# Patient Record
Sex: Female | Born: 1970 | Race: Black or African American | Hispanic: No | Marital: Married | Smoking: Never smoker
Health system: Southern US, Community
[De-identification: ages and names within clinical notes are randomized; demographics above are authoritative.]

## PROBLEM LIST (undated history)

## (undated) DIAGNOSIS — I10 Essential (primary) hypertension: Secondary | ICD-10-CM

## (undated) DIAGNOSIS — J90 Pleural effusion, not elsewhere classified: Secondary | ICD-10-CM

## (undated) DIAGNOSIS — D649 Anemia, unspecified: Secondary | ICD-10-CM

## (undated) DIAGNOSIS — A1889 Tuberculosis of other sites: Secondary | ICD-10-CM

## (undated) HISTORY — PX: NO PAST SURGERIES: SHX2092

---

## 2015-03-09 ENCOUNTER — Ambulatory Visit: Payer: Self-pay

## 2015-05-11 DIAGNOSIS — J90 Pleural effusion, not elsewhere classified: Secondary | ICD-10-CM

## 2015-05-11 HISTORY — DX: Pleural effusion, not elsewhere classified: J90

## 2015-05-18 ENCOUNTER — Emergency Department (HOSPITAL_COMMUNITY)
Admission: EM | Admit: 2015-05-18 | Discharge: 2015-05-18 | Disposition: A | Discharge status: Home / Self Care | Payer: Medicaid Other | Source: Home / Self Care | Attending: Emergency Medicine | Admitting: Emergency Medicine

## 2015-05-18 ENCOUNTER — Encounter (HOSPITAL_COMMUNITY): Payer: Self-pay | Admitting: Emergency Medicine

## 2015-05-18 ENCOUNTER — Emergency Department (INDEPENDENT_AMBULATORY_CARE_PROVIDER_SITE_OTHER): Payer: Medicaid Other

## 2015-05-18 DIAGNOSIS — K2970 Gastritis, unspecified, without bleeding: Secondary | ICD-10-CM

## 2015-05-18 DIAGNOSIS — K8020 Calculus of gallbladder without cholecystitis without obstruction: Secondary | ICD-10-CM

## 2015-05-18 HISTORY — DX: Essential (primary) hypertension: I10

## 2015-05-18 HISTORY — DX: Anemia, unspecified: D64.9

## 2015-05-18 LAB — CBC WITH DIFFERENTIAL/PLATELET
BASOS ABS: 0 10*3/uL (ref 0.0–0.1)
Basophils Relative: 0 %
Eosinophils Absolute: 0.1 10*3/uL (ref 0.0–0.7)
Eosinophils Relative: 1 %
HCT: 29.7 % — ABNORMAL LOW (ref 36.0–46.0)
Hemoglobin: 8.6 g/dL — ABNORMAL LOW (ref 12.0–15.0)
LYMPHS ABS: 2 10*3/uL (ref 0.7–4.0)
Lymphocytes Relative: 30 %
MCH: 20.4 pg — ABNORMAL LOW (ref 26.0–34.0)
MCHC: 29 g/dL — ABNORMAL LOW (ref 30.0–36.0)
MCV: 70.4 fL — ABNORMAL LOW (ref 78.0–100.0)
MONO ABS: 0.5 10*3/uL (ref 0.1–1.0)
MONOS PCT: 8 %
NEUTROS PCT: 61 %
Neutro Abs: 4.2 10*3/uL (ref 1.7–7.7)
PLATELETS: 260 10*3/uL (ref 150–400)
RBC: 4.22 MIL/uL (ref 3.87–5.11)
RDW: 16.9 % — AB (ref 11.5–15.5)
WBC: 6.8 10*3/uL (ref 4.0–10.5)

## 2015-05-18 LAB — COMPREHENSIVE METABOLIC PANEL
ALBUMIN: 3.4 g/dL — AB (ref 3.5–5.0)
ALT: 11 U/L — ABNORMAL LOW (ref 14–54)
AST: 16 U/L (ref 15–41)
Alkaline Phosphatase: 72 U/L (ref 38–126)
Anion gap: 7 (ref 5–15)
BUN: 7 mg/dL (ref 6–20)
CHLORIDE: 106 mmol/L (ref 101–111)
CO2: 26 mmol/L (ref 22–32)
Calcium: 9.1 mg/dL (ref 8.9–10.3)
Creatinine, Ser: 0.84 mg/dL (ref 0.44–1.00)
GFR calc Af Amer: >60 mL/min (ref >60)
GFR calc non Af Amer: >60 mL/min (ref >60)
GLUCOSE: 118 mg/dL — AB (ref 65–99)
POTASSIUM: 4.1 mmol/L (ref 3.5–5.1)
Sodium: 139 mmol/L (ref 135–145)
Total Bilirubin: 0.5 mg/dL (ref 0.3–1.2)
Total Protein: 7.7 g/dL (ref 6.5–8.1)

## 2015-05-18 LAB — LIPASE, BLOOD: Lipase: 34 U/L (ref 11–51)

## 2015-05-18 MED ORDER — TRAMADOL HCL 50 MG PO TABS: 50.0000 mg | ORAL_TABLET | Freq: Four times a day (QID) | ORAL | Status: DC | PRN

## 2015-05-18 MED ORDER — ONDANSETRON HCL 4 MG PO TABS: 4.0000 mg | ORAL_TABLET | Freq: Three times a day (TID) | ORAL | Status: DC | PRN

## 2015-05-18 MED ORDER — OMEPRAZOLE 20 MG PO CPDR: 20.0000 mg | DELAYED_RELEASE_CAPSULE | Freq: Every day | ORAL | Status: DC

## 2015-05-18 NOTE — ED Provider Notes (Addendum)
CSN: [id number]     Arrival date & time 05/18/15  1616 History   First MD Initiated Contact with Patient 05/18/15 1728     Chief Complaint  Patient presents with  . Abdominal Pain    mid upper  . Diarrhea  . Emesis  . Chest Pain    right lower   (Consider location/radiation/quality/duration/timing/severity/associated sxs/prior Treatment) HPI  She is a 45 year old woman here for evaluation of abdominal pain. A phone interpreter was used for this encounter. For the last 2 weeks she has been having crampy epigastric pain any time she eats. She states as long as she does not eat anything she feels normal. Within 5 minutes of eating she will develop pain, nausea, nonbloody nonbilious vomiting, and nonbloody diarrhea.  She will also occasionally get sharp pains in the right upper quadrant that radiates to the right shoulder. This has happened 3 times in the last 2 weeks, most recently yesterday. This tends to occur about an hour after eating. She also reports intermittent fevers, once when this first started and another time 2 days ago. No history of abdominal surgery.  Past Medical History  Diagnosis Date  . Hypertension   . Anemia    History reviewed. No pertinent past surgical history. History reviewed. No pertinent family history. Social History  Substance Use Topics  . Smoking status: Never Smoker   . Smokeless tobacco: None  . Alcohol Use: No   OB History    No data available     Review of Systems  As in history of present illness Allergies  Review of patient's allergies indicates no known allergies.  Home Medications   Prior to Admission medications   Medication Sig Start Date End Date Taking? Authorizing Provider  lisinopril (PRINIVIL,ZESTRIL) 10 MG tablet Take 10 mg by mouth daily.   Yes Historical Provider, MD  omeprazole (PRILOSEC) 20 MG capsule Take 1 capsule (20 mg total) by mouth daily. 05/18/15   Charm Rings, MD  ondansetron (ZOFRAN) 4 MG tablet Take 1 tablet (4 mg  total) by mouth every 8 (eight) hours as needed for nausea or vomiting. 05/18/15   Charm Rings, MD  traMADol (ULTRAM) 50 MG tablet Take 1 tablet (50 mg total) by mouth every 6 (six) hours as needed. 05/18/15   Charm Rings, MD   Meds Ordered and Administered this Visit  Medications - No data to display  BP 146/70 mmHg  Pulse 78  Temp(Src) 98.8 F (37.1 C) (Oral)  Resp 18  SpO2 100%  LMP 05/04/2015 (Exact Date) No data found.   Physical Exam  Constitutional: She is oriented to person, place, and time. She appears well-developed and well-nourished. No distress.  Neck: Neck supple.  Cardiovascular: Normal rate, regular rhythm and normal heart sounds.   No murmur heard. Pulmonary/Chest: Effort normal and breath sounds normal. No respiratory distress. She has no wheezes. She has no rales.  Abdominal: Soft. Bowel sounds are normal. She exhibits no distension and no mass. There is tenderness (in epigastric and right upper quadrant). There is no rebound and no guarding.  She does have a positive Murphy sign. Deep palpation in the right upper quadrant has pain radiating to her right shoulder  Neurological: She is alert and oriented to person, place, and time.    ED Course  Procedures (including critical care time)  Labs Review Labs Reviewed  CBC WITH DIFFERENTIAL/PLATELET - Abnormal; Notable for the following:    Hemoglobin 8.6 (*)    HCT 29.7 (*)  MCV 70.4 (*)    MCH 20.4 (*)    MCHC 29.0 (*)    RDW 16.9 (*)    All other components within normal limits  COMPREHENSIVE METABOLIC PANEL  LIPASE, BLOOD    Imaging Review Dg Abd 2 Views  05/18/2015  CLINICAL DATA:  Right upper quadrant pain x2 weeks, low-grade fever EXAM: ABDOMEN - 2 VIEW COMPARISON:  None. FINDINGS: Nonobstructive bowel gas pattern. Moderate right colonic stool burden. No evidence of free air under the diaphragm on the upright view. Visualized osseous structures are within normal limits. IMPRESSION: No evidence of small  bowel obstruction or free air. Moderate right colonic stool burden. Electronically Signed   By: Charline Bills M.D.   On: 05/18/2015 18:48      MDM   1. Gastritis   2. Gallstone    History and exam concerning for cholelithiasis and gastritis. No fevers or tachycardia to suggest cholecystitis at this time.  Abdominal films unremarkable. We'll treat symptomatically with omeprazole, Zofran, and tramadol. She has an appointment at the [facility] scheduled for March 13 to establish care. I have discussed with the patient via the interpreter that she needs to let the doctor know about her abdominal pain. I suspect she will need an abdominal ultrasound at some point, but no urgent indication for one tonight. If her pain is getting worse, the medicines are not helping, or she develops fevers, she is to go to the emergency room.  I reviewed her discharge instructions and provided a printed handout. All questions were answered.  Charm Rings, MD 05/18/15 1610  Charm Rings, MD 05/18/15 1911

## 2015-05-18 NOTE — Discharge Instructions (Signed)
You likely have gallstones that are causing your pain. Take omeprazole daily. Take Zofran every 8 hours as needed for nausea or vomiting. Take tramadol every 8 hours as needed for severe pain. Follow-up at the [facility] cell clinic as scheduled in March. If your pain is getting worse or you develop daily fevers, please go to the emergency room.

## 2015-05-18 NOTE — ED Notes (Signed)
Pt was assessed using an Arabic interpreter [phone].  Pt has been in the country for four months from [place].  She is complaining of mid upper abdominal pain after eating accompanied with vomiting and diarrhea.  Pt only gets the pain when she eats.  She is also having right shoulder and right lower chest stabbing pain, that she has been getting about 3 times a week for the last two weeks not accompanying the abdominal pain.  She reports severe chills at times as well.

## 2015-05-30 ENCOUNTER — Encounter (HOSPITAL_COMMUNITY): Payer: Self-pay | Admitting: Emergency Medicine

## 2015-05-30 ENCOUNTER — Emergency Department (HOSPITAL_COMMUNITY)
Admission: EM | Admit: 2015-05-30 | Discharge: 2015-05-30 | Disposition: A | Discharge status: Home / Self Care | Payer: Medicaid Other | Source: Home / Self Care | Attending: Family Medicine | Admitting: Family Medicine

## 2015-05-30 ENCOUNTER — Emergency Department (INDEPENDENT_AMBULATORY_CARE_PROVIDER_SITE_OTHER): Payer: Medicaid Other

## 2015-05-30 DIAGNOSIS — R61 Generalized hyperhidrosis: Secondary | ICD-10-CM | POA: Diagnosis present

## 2015-05-30 DIAGNOSIS — Z79899 Other long term (current) drug therapy: Secondary | ICD-10-CM

## 2015-05-30 DIAGNOSIS — M25511 Pain in right shoulder: Secondary | ICD-10-CM | POA: Diagnosis present

## 2015-05-30 DIAGNOSIS — R7303 Prediabetes: Secondary | ICD-10-CM | POA: Diagnosis present

## 2015-05-30 DIAGNOSIS — A156 Tuberculous pleurisy: Principal | ICD-10-CM | POA: Diagnosis present

## 2015-05-30 DIAGNOSIS — D509 Iron deficiency anemia, unspecified: Secondary | ICD-10-CM | POA: Diagnosis present

## 2015-05-30 DIAGNOSIS — J9600 Acute respiratory failure, unspecified whether with hypoxia or hypercapnia: Secondary | ICD-10-CM | POA: Diagnosis present

## 2015-05-30 DIAGNOSIS — Z23 Encounter for immunization: Secondary | ICD-10-CM

## 2015-05-30 DIAGNOSIS — J90 Pleural effusion, not elsewhere classified: Secondary | ICD-10-CM

## 2015-05-30 DIAGNOSIS — J948 Other specified pleural conditions: Secondary | ICD-10-CM

## 2015-05-30 DIAGNOSIS — J189 Pneumonia, unspecified organism: Secondary | ICD-10-CM | POA: Diagnosis present

## 2015-05-30 DIAGNOSIS — I10 Essential (primary) hypertension: Secondary | ICD-10-CM | POA: Diagnosis present

## 2015-05-30 LAB — CBC WITH DIFFERENTIAL/PLATELET
BASOS ABS: 0.1 10*3/uL (ref 0.0–0.1)
BASOS PCT: 1 %
EOS ABS: 0.1 10*3/uL (ref 0.0–0.7)
Eosinophils Relative: 2 %
HCT: 28 % — ABNORMAL LOW (ref 36.0–46.0)
Hemoglobin: 8.4 g/dL — ABNORMAL LOW (ref 12.0–15.0)
LYMPHS ABS: 1.6 10*3/uL (ref 0.7–4.0)
Lymphocytes Relative: 24 %
MCH: 21.1 pg — AB (ref 26.0–34.0)
MCHC: 30 g/dL (ref 30.0–36.0)
MCV: 70.4 fL — AB (ref 78.0–100.0)
MONO ABS: 0.7 10*3/uL (ref 0.1–1.0)
Monocytes Relative: 10 %
NEUTROS ABS: 4.1 10*3/uL (ref 1.7–7.7)
Neutrophils Relative %: 63 %
PLATELETS: 369 10*3/uL (ref 150–400)
RBC: 3.98 MIL/uL (ref 3.87–5.11)
RDW: 17.3 % — AB (ref 11.5–15.5)
WBC: 6.6 10*3/uL (ref 4.0–10.5)

## 2015-05-30 LAB — BASIC METABOLIC PANEL
Anion gap: 11 (ref 5–15)
BUN: 6 mg/dL (ref 6–20)
CO2: 25 mmol/L (ref 22–32)
CREATININE: 0.56 mg/dL (ref 0.44–1.00)
Calcium: 8.9 mg/dL (ref 8.9–10.3)
Chloride: 103 mmol/L (ref 101–111)
GFR calc Af Amer: >60 mL/min (ref >60)
GLUCOSE: 95 mg/dL (ref 65–99)
POTASSIUM: 3.7 mmol/L (ref 3.5–5.1)
SODIUM: 139 mmol/L (ref 135–145)

## 2015-05-30 NOTE — Discharge Instructions (Signed)
You have a build up of fluid in the outer aspect of your lung  This may be infection, or some other type of consolidation.  You may need a CT scan of your chest to determine what the process stems from  Your ED provider will get you sorted out

## 2015-05-30 NOTE — ED Notes (Signed)
Per translator phone: pt from [facility] for eval of cough with sob x1 week, pt states has coughig spells. Pt states productive cough with yellow sputum, sent by [facility] for eval of fluid on right side of lung. Pt in nad able to speak complete sentences.

## 2015-05-30 NOTE — ED Notes (Signed)
The patient presented to the [facility] with a complaint of chest wall pain when coughing and a sore throat x 2 weeks.

## 2015-05-30 NOTE — ED Provider Notes (Signed)
CSN: [id number]     Arrival date & time 05/30/15  1550 History   First MD Initiated Contact with Patient 05/30/15 1740     Chief Complaint  Patient presents with  . Cough  . Sore Throat  . Pleurisy   (Consider location/radiation/quality/duration/timing/severity/associated sxs/prior Treatment) HPI history obtained from patient interpreter Pt was seen last week for abdominal pain. Now with cough. For over 1 week. Hurts to sleep on right side. Finds herself SOB when laying down. No peripheral swelling. No CP, no exertional CP or SOB she states that she mentioned to the last doctor that she had previous similar symptoms but concentration was on her abdomen. Past Medical History  Diagnosis Date  . Hypertension   . Anemia    History reviewed. No pertinent past surgical history. History reviewed. No pertinent family history. Social History  Substance Use Topics  . Smoking status: Never Smoker   . Smokeless tobacco: None  . Alcohol Use: No   OB History    No data available     Review of Systems cough Allergies  Review of patient's allergies indicates no known allergies.  Home Medications   Prior to Admission medications   Medication Sig Start Date End Date Taking? Authorizing Provider  lisinopril (PRINIVIL,ZESTRIL) 10 MG tablet Take 10 mg by mouth daily.   Yes Historical Provider, MD  omeprazole (PRILOSEC) 20 MG capsule Take 1 capsule (20 mg total) by mouth daily. 05/18/15  Yes Charm Rings, MD  ondansetron (ZOFRAN) 4 MG tablet Take 1 tablet (4 mg total) by mouth every 8 (eight) hours as needed for nausea or vomiting. 05/18/15  Yes Charm Rings, MD  traMADol (ULTRAM) 50 MG tablet Take 1 tablet (50 mg total) by mouth every 6 (six) hours as needed. 05/18/15  Yes Charm Rings, MD   Meds Ordered and Administered this Visit  Medications - No data to display  BP 141/92 mmHg  Pulse 118  Temp(Src) 98.7 F (37.1 C) (Oral)  Resp 22  SpO2 96%  LMP 05/04/2015 (Exact Date) No data  found.   Physical Exam  Constitutional: She is oriented to person, place, and time. She appears well-developed and well-nourished.  HENT:  Head: Normocephalic and atraumatic.  Right Ear: External ear normal.  Left Ear: External ear normal.  Eyes: Conjunctivae are normal.  Neck: Normal range of motion. Neck supple.  Cardiovascular: Normal rate.   Pulmonary/Chest: Effort normal.  Significantly diminished breath sounds on the right. Dullness to percussion.  Abdominal: Soft.  Musculoskeletal: Normal range of motion.  Neurological: She is alert and oriented to person, place, and time.  Skin: Skin is warm.  Psychiatric: She has a normal mood and affect. Her behavior is normal.  Nursing note and vitals reviewed.   ED Course  Procedures (including critical care time)  Labs Review Labs Reviewed - No data to display  Imaging Review No results found.   Visual Acuity Review  Right Eye Distance:   Left Eye Distance:   Bilateral Distance:    Right Eye Near:   Left Eye Near:    Bilateral Near:        Through interpreter review of x-ray with patient and her husband. MDM   1. Pleural effusion on right    Go to ER for further evaluation    Tharon Aquas, [place] 05/30/15 2051

## 2015-05-31 ENCOUNTER — Emergency Department (HOSPITAL_COMMUNITY): Payer: Medicaid Other

## 2015-05-31 ENCOUNTER — Inpatient Hospital Stay (HOSPITAL_COMMUNITY): Payer: Medicaid Other

## 2015-05-31 ENCOUNTER — Inpatient Hospital Stay (HOSPITAL_COMMUNITY)
Admission: EM | Admit: 2015-05-31 | Discharge: 2015-06-06 | DRG: 177 | Disposition: A | Discharge status: Home / Self Care | Payer: Medicaid Other | Attending: Internal Medicine | Admitting: Internal Medicine

## 2015-05-31 ENCOUNTER — Encounter (HOSPITAL_COMMUNITY): Payer: Self-pay | Admitting: Radiology

## 2015-05-31 DIAGNOSIS — M25511 Pain in right shoulder: Secondary | ICD-10-CM | POA: Diagnosis present

## 2015-05-31 DIAGNOSIS — R0600 Dyspnea, unspecified: Secondary | ICD-10-CM

## 2015-05-31 DIAGNOSIS — J9600 Acute respiratory failure, unspecified whether with hypoxia or hypercapnia: Secondary | ICD-10-CM | POA: Diagnosis present

## 2015-05-31 DIAGNOSIS — Z79899 Other long term (current) drug therapy: Secondary | ICD-10-CM | POA: Diagnosis not present

## 2015-05-31 DIAGNOSIS — I10 Essential (primary) hypertension: Secondary | ICD-10-CM | POA: Diagnosis present

## 2015-05-31 DIAGNOSIS — J9601 Acute respiratory failure with hypoxia: Secondary | ICD-10-CM | POA: Diagnosis not present

## 2015-05-31 DIAGNOSIS — R0781 Pleurodynia: Secondary | ICD-10-CM | POA: Diagnosis present

## 2015-05-31 DIAGNOSIS — D509 Iron deficiency anemia, unspecified: Secondary | ICD-10-CM | POA: Diagnosis present

## 2015-05-31 DIAGNOSIS — R509 Fever, unspecified: Secondary | ICD-10-CM | POA: Diagnosis not present

## 2015-05-31 DIAGNOSIS — A1889 Tuberculosis of other sites: Secondary | ICD-10-CM

## 2015-05-31 DIAGNOSIS — R61 Generalized hyperhidrosis: Secondary | ICD-10-CM | POA: Diagnosis present

## 2015-05-31 DIAGNOSIS — Z9889 Other specified postprocedural states: Secondary | ICD-10-CM | POA: Diagnosis not present

## 2015-05-31 DIAGNOSIS — J90 Pleural effusion, not elsewhere classified: Secondary | ICD-10-CM | POA: Diagnosis not present

## 2015-05-31 DIAGNOSIS — J9602 Acute respiratory failure with hypercapnia: Secondary | ICD-10-CM | POA: Diagnosis not present

## 2015-05-31 DIAGNOSIS — Z23 Encounter for immunization: Secondary | ICD-10-CM | POA: Diagnosis not present

## 2015-05-31 DIAGNOSIS — R0603 Acute respiratory distress: Secondary | ICD-10-CM | POA: Insufficient documentation

## 2015-05-31 DIAGNOSIS — A156 Tuberculous pleurisy: Secondary | ICD-10-CM | POA: Diagnosis present

## 2015-05-31 DIAGNOSIS — Z603 Acculturation difficulty: Secondary | ICD-10-CM

## 2015-05-31 DIAGNOSIS — R05 Cough: Secondary | ICD-10-CM | POA: Diagnosis present

## 2015-05-31 DIAGNOSIS — J189 Pneumonia, unspecified organism: Secondary | ICD-10-CM | POA: Diagnosis present

## 2015-05-31 DIAGNOSIS — R739 Hyperglycemia, unspecified: Secondary | ICD-10-CM | POA: Diagnosis present

## 2015-05-31 DIAGNOSIS — R7303 Prediabetes: Secondary | ICD-10-CM | POA: Diagnosis present

## 2015-05-31 DIAGNOSIS — J948 Other specified pleural conditions: Secondary | ICD-10-CM | POA: Diagnosis not present

## 2015-05-31 DIAGNOSIS — M25519 Pain in unspecified shoulder: Secondary | ICD-10-CM

## 2015-05-31 HISTORY — DX: Tuberculosis of other sites: A18.89

## 2015-05-31 HISTORY — DX: Pleural effusion, not elsewhere classified: J90

## 2015-05-31 LAB — IRON AND TIBC
IRON: 11 ug/dL — AB (ref 28–170)
SATURATION RATIOS: 4 % — AB (ref 10.4–31.8)
TIBC: 294 ug/dL (ref 250–450)
UIBC: 283 ug/dL

## 2015-05-31 LAB — BODY FLUID CELL COUNT WITH DIFFERENTIAL
LYMPHS FL: 67 %
MONOCYTE-MACROPHAGE-SEROUS FLUID: 10 % — AB (ref 50–90)
NEUTROPHIL FLUID: 23 % (ref 0–25)
WBC FLUID: 5629 uL — AB (ref 0–1000)

## 2015-05-31 LAB — GRAM STAIN

## 2015-05-31 LAB — GLUCOSE, SEROUS FLUID: Glucose, Fluid: 80 mg/dL

## 2015-05-31 LAB — PROTEIN, BODY FLUID: TOTAL PROTEIN, FLUID: 5.6 g/dL

## 2015-05-31 LAB — LACTATE DEHYDROGENASE, PLEURAL OR PERITONEAL FLUID: LD, Fluid: 674 U/L — ABNORMAL HIGH (ref 3–23)

## 2015-05-31 LAB — VITAMIN B12: Vitamin B-12: 2061 pg/mL — ABNORMAL HIGH (ref 180–914)

## 2015-05-31 LAB — INFLUENZA PANEL BY PCR (TYPE A & B)
H1N1FLUPCR: NOT DETECTED
INFLAPCR: NEGATIVE
INFLBPCR: NEGATIVE

## 2015-05-31 LAB — RETICULOCYTES
RBC.: 3.88 MIL/uL (ref 3.87–5.11)
Retic Count, Absolute: 50.4 10*3/uL (ref 19.0–186.0)
Retic Ct Pct: 1.3 % (ref 0.4–3.1)

## 2015-05-31 LAB — FOLATE: FOLATE: 28.8 ng/mL (ref >5.9)

## 2015-05-31 LAB — FERRITIN: Ferritin: 15 ng/mL (ref 11–307)

## 2015-05-31 LAB — TSH: TSH: 1.269 u[IU]/mL (ref 0.350–4.500)

## 2015-05-31 LAB — LACTATE DEHYDROGENASE: LDH: 198 U/L — AB (ref 98–192)

## 2015-05-31 MED ORDER — FERUMOXYTOL INJECTION 510 MG/17 ML: 510.0000 mg | INTRAVENOUS | Status: DC

## 2015-05-31 MED ORDER — CEFTRIAXONE SODIUM 1 G IJ SOLR
1.0000 g | INTRAMUSCULAR | Status: DC
  Administered 2015-06-01 – 2015-06-06 (×6): 1 g via INTRAVENOUS
  Filled 2015-05-31 (×7): qty 10

## 2015-05-31 MED ORDER — DEXTROSE 5 % IV SOLN
500.0000 mg | Freq: Once | INTRAVENOUS | Status: AC
  Administered 2015-05-31: 500 mg via INTRAVENOUS
  Filled 2015-05-31: qty 500

## 2015-05-31 MED ORDER — DEXTROSE 5 % IV SOLN: 1.0000 g | INTRAVENOUS | Status: DC

## 2015-05-31 MED ORDER — DEXTROSE 5 % IV SOLN
1.0000 g | Freq: Once | INTRAVENOUS | Status: AC
  Administered 2015-05-31: 1 g via INTRAVENOUS
  Filled 2015-05-31: qty 10

## 2015-05-31 MED ORDER — LIDOCAINE HCL (PF) 1 % IJ SOLN
INTRAMUSCULAR | Status: AC
  Filled 2015-05-31: qty 10

## 2015-05-31 MED ORDER — HYDROCODONE-ACETAMINOPHEN 5-325 MG PO TABS
1.0000 | ORAL_TABLET | Freq: Once | ORAL | Status: AC
  Administered 2015-05-31: 1 via ORAL
  Filled 2015-05-31: qty 1

## 2015-05-31 MED ORDER — LISINOPRIL 20 MG PO TABS
20.0000 mg | ORAL_TABLET | Freq: Once | ORAL | Status: AC
  Administered 2015-05-31: 20 mg via ORAL
  Filled 2015-05-31: qty 1

## 2015-05-31 MED ORDER — DEXTROSE 5 % IV SOLN: 500.0000 mg | INTRAVENOUS | Status: DC

## 2015-05-31 MED ORDER — LISINOPRIL 20 MG PO TABS: 20.0000 mg | ORAL_TABLET | Freq: Every day | ORAL | Status: DC

## 2015-05-31 MED ORDER — AZITHROMYCIN 500 MG IV SOLR
500.0000 mg | INTRAVENOUS | Status: DC
  Administered 2015-06-01: 500 mg via INTRAVENOUS
  Filled 2015-05-31: qty 500

## 2015-05-31 MED ORDER — SODIUM CHLORIDE 0.9 % IV SOLN
INTRAVENOUS | Status: DC
  Administered 2015-05-31: 10 mL/h via INTRAVENOUS
  Administered 2015-06-04: 19:00:00 via INTRAVENOUS

## 2015-05-31 MED ORDER — ENOXAPARIN SODIUM 40 MG/0.4ML ~~LOC~~ SOLN
40.0000 mg | SUBCUTANEOUS | Status: DC
  Administered 2015-05-31 – 2015-06-05 (×6): 40 mg via SUBCUTANEOUS
  Filled 2015-05-31 (×7): qty 0.4

## 2015-05-31 MED ORDER — HYDROCODONE-ACETAMINOPHEN 7.5-325 MG/15ML PO SOLN: 10.0000 mL | Freq: Once | ORAL | Status: DC

## 2015-05-31 MED ORDER — LISINOPRIL 20 MG PO TABS
20.0000 mg | ORAL_TABLET | Freq: Every day | ORAL | Status: DC
  Administered 2015-06-01 – 2015-06-06 (×6): 20 mg via ORAL
  Filled 2015-05-31 (×6): qty 1

## 2015-05-31 MED ORDER — SODIUM CHLORIDE 0.9 % IV SOLN
510.0000 mg | INTRAVENOUS | Status: DC
  Filled 2015-05-31: qty 17

## 2015-05-31 MED ORDER — IOHEXOL 300 MG/ML  SOLN
100.0000 mL | Freq: Once | INTRAMUSCULAR | Status: AC | PRN
  Administered 2015-05-31: 100 mL via INTRAVENOUS

## 2015-05-31 NOTE — ED Notes (Signed)
CT notified of IV placement. 

## 2015-05-31 NOTE — ED Notes (Signed)
Called Pharmacy to reset medications

## 2015-05-31 NOTE — ED Notes (Signed)
Admitting MD Merrel and Phlebotomy at the bedside

## 2015-05-31 NOTE — ED Notes (Signed)
This RN attempted to start IV.  Patient requesting no IV until MD has required it.  Will follow up after MD has seen patient.

## 2015-05-31 NOTE — ED Notes (Signed)
Pt returned from US

## 2015-05-31 NOTE — Procedures (Signed)
Interventional Radiology Procedure Note  Procedure: right US guided thora  Complications: None Recommendations:  - Ok to shower tomorrow - Do not submerge for 7 days - Routine care - follow up labs -pending CXR   Signed,  Yvone Neu. Loreta Ave, DO

## 2015-05-31 NOTE — Plan of Care (Signed)
45 year old female presents with shortness of breath and CT scan shows large right pleural effusion. Possible cause could be pneumonia. Patient will need thoracentesis.  [name].

## 2015-05-31 NOTE — ED Notes (Signed)
Phlebotomy at the bedside  

## 2015-05-31 NOTE — ED Notes (Signed)
Preston Fleeting, MD at bedside using pacific interpreter for interview and assessment

## 2015-05-31 NOTE — H&P (Signed)
[facility] History and Physical                                                                                    Sheena Ramirez, is a 45 y.o. female  MRN: [id number]   DOB - 1970-07-17  Admit Date - 05/31/2015  Outpatient Primary MD UNASSIGNED  Referring MD: Preston Fleeting / ER  PMH: Past Medical History  Diagnosis Date  . Hypertension   . Anemia       PSH: History reviewed. No pertinent past surgical history.   CC:  Chief Complaint  Patient presents with  . Cough     HPI: This is a 45 year old [place] female patient, has resided in the [place] 4 months (speaks only Arabic) who presents to the ER with complaints of pleuritic chest pain, fatigue and malaise and subjective fevers ongoing for about 1 week. She was having upper respiratory infection symptoms. No lower extremity swelling. No issues with dyspnea on exertion, shortness of breath or chest pain prior to onset of current symptomatology. She is up-to-date on her immunizations. She has no risk factors for TB (did not stay in refugee camp or other close quarters prior to coming to the [place]). Her only past medical history is hypertension and anemia. Likely iron deficient since husband reports she takes iron pills. She continues to have her menses. She apparently has never had a mammogram. History obtained with assistance of husband as well as with Print production planner.  ER Evaluation and treatment: Temperature 98.5, BP 155/88, pulse 99, respirations 16, at rest room air saturations 100% 2 View CXR: Increase in moderate right pleural effusion with adjacent consolidation and atelectasis  CT of chest with contrast: Moderate to large right-sided pleural effusion with layering partial consolidation right lower lobe-pneumonia +/- underlying mass cannot be excluded with diagnostic thoracentesis recommended-also a mild leftward shift of the mediastinum reflecting the pleural effusion Vicodin 5-3 251 Rocephin 1 g IV 1 Zithromax  500 mg IV 1   Review of Systems   In addition to the HPI above,  No Headache, changes with Vision or hearing, new weakness, tingling, numbness in any extremity, No problems swallowing food or Liquids, indigestion/reflux No palpitations, orthopnea  No Abdominal pain, N/V; no melena or hematochezia, no dark tarry stools, Bowel movements are regular, No dysuria, hematuria or flank pain No new skin rashes, lesions, masses or bruises, No new joints pains-aches No recent weight gain or loss No polyuria, polydypsia or polyphagia,  *A full 10 point Review of Systems was done, except as stated above, all other Review of Systems were negative.  Social History Social History  Substance Use Topics  . Smoking status: Never Smoker   . Smokeless tobacco: Not on file  . Alcohol Use: No    Resides at: Private residence  Lives with: Spouse and children  Ambulatory status: Without assistive devices   Family History Family History  Problem Relation Age of Onset  . Anemia Mother   . Heart disease Mother      Prior to Admission medications   Medication Sig Start Date End Date Taking? Authorizing Provider  lisinopril (PRINIVIL,ZESTRIL) 10 MG tablet Take 20 mg by  mouth daily.    Yes Historical Provider, MD  ondansetron (ZOFRAN) 4 MG tablet Take 1 tablet (4 mg total) by mouth every 8 (eight) hours as needed for nausea or vomiting. 05/18/15  Yes Charm Rings, MD  traMADol (ULTRAM) 50 MG tablet Take 1 tablet (50 mg total) by mouth every 6 (six) hours as needed. Patient taking differently: Take 50 mg by mouth every 6 (six) hours as needed for moderate pain.  05/18/15  Yes Charm Rings, MD    No Known Allergies  Physical Exam  Vitals  Blood pressure 143/89, pulse 101, temperature 98.5 F (36.9 C), temperature source Oral, resp. rate 24, last menstrual period 05/04/2015, SpO2 100 %.   General:  In no acute distress, appears healthy and well nourished  Psych:  Normal affect, Awake Alert,  Oriented X 3. Speech and thought patterns are clear and appropriate (obtained with assistance of Print production planner), no apparent short term memory deficits  Neuro:   No focal neurological deficits, CN II through XII intact, Strength 5/5 all 4 extremities, Sensation intact all 4 extremities.  ENT:  Ears and Eyes appear Normal, Conjunctivae clear, PER. Moist oral mucosa without erythema or exudates.  Neck:  Supple, No lymphadenopathy appreciated  Respiratory:  Symmetrical chest wall movement, very diminished right side up to mid fields, otherwise clear to auscultation. Room Air  Cardiac:  RRR, No Murmurs, no LE edema noted, no JVD, No carotid bruits, peripheral pulses palpable at 2+  Abdomen:  Positive bowel sounds, Soft, Non tender, Non distended,  No masses appreciated, no obvious hepatosplenomegaly  Skin:  No Cyanosis, Normal Skin Turgor, No Skin Rash or Bruise.  Extremities: Symmetrical without obvious trauma or injury,  no effusions.  Data Review  CBC  Recent Labs Lab 05/30/15 2007  WBC 6.6  HGB 8.4*  HCT 28.0*  PLT 369  MCV 70.4*  MCH 21.1*  MCHC 30.0  RDW 17.3*  LYMPHSABS 1.6  MONOABS 0.7  EOSABS 0.1  BASOSABS 0.1    Chemistries   Recent Labs Lab 05/30/15 2007  NA 139  K 3.7  CL 103  CO2 25  GLUCOSE 95  BUN 6  CREATININE 0.56  CALCIUM 8.9    CrCl cannot be calculated (Unknown ideal weight.).  No results for input(s): TSH, T4TOTAL, T3FREE, THYROIDAB in the last 72 hours.  Invalid input(s): FREET3  Coagulation profile No results for input(s): INR, PROTIME in the last 168 hours.  No results for input(s): DDIMER in the last 72 hours.  Cardiac Enzymes No results for input(s): CKMB, TROPONINI, MYOGLOBIN in the last 168 hours.  Invalid input(s): CK  Invalid input(s): POCBNP  Urinalysis No results found for: COLORURINE, APPEARANCEUR, LABSPEC, PHURINE, GLUCOSEU, HGBUR, BILIRUBINUR, KETONESUR, PROTEINUR, UROBILINOGEN, NITRITE,  LEUKOCYTESUR  Imaging results:   Dg Chest 2 View  05/30/2015  CLINICAL DATA:  Pt soes not understand english, PA had a interpater on the phone to talk with pt. Cough, shortness of breath. EXAM: CHEST - 2 VIEW COMPARISON:  05/18/2015 FINDINGS: Moderate right pleural effusion. Atelectasis/ consolidation of the right lung base some increase in right perihilar atelectasis since prior study. Left lung clear. Heart size normal. No pneumothorax. Visualized skeletal structures are unremarkable. IMPRESSION: 1. Increase in moderate right pleural effusion, with adjacent consolidation/atelectasis. Electronically Signed   By: Corlis Leak M.D.   On: 05/30/2015 18:22   Ct Chest W Contrast  05/31/2015  CLINICAL DATA:  Acute onset of nonproductive cough, shortness of breath, subjective fever, nausea and vomiting. Chills  and right upper chest pain. Right upper back pain and right arm pain. Initial encounter. EXAM: CT CHEST WITH CONTRAST TECHNIQUE: Multidetector CT imaging of the chest was performed during intravenous contrast administration. CONTRAST:  OMNIPAQUE IOHEXOL 300 MG/ML  SOLN COMPARISON:  Chest radiograph performed 05/30/2015 FINDINGS: A moderate to large right-sided pleural effusion is noted, with partial consolidation of the right lower lobe. Haziness is noted within the expanded portions of the right lung. The left lung appears relatively clear. No pneumothorax is seen. No definite masses are identified. There is mild leftward shift of the mediastinum. No definite mass is seen. The great vessels are grossly unremarkable in appearance. No mediastinal lymphadenopathy is seen. No pericardial effusion is identified. The visualized portions of thyroid gland are unremarkable. No axillary lymphadenopathy is seen. The visualized portions of the liver and spleen are unremarkable. The visualized portions of the gallbladder, pancreas, adrenal glands and kidneys are within normal limits. No acute osseous abnormalities  are identified. IMPRESSION: 1. Moderate to large right-sided pleural effusion, with partial consolidation of the right lower lobe. The pleural effusion is of uncertain etiology. Pneumonia could have such an appearance. Underlying mass cannot be excluded. Diagnostic thoracentesis could be considered for further evaluation, as deemed clinically appropriate. 2. Mild leftward shift of the mediastinum, reflecting the pleural effusion. Electronically Signed   By: Roanna Raider M.D.   On: 05/31/2015 05:07   Dg Abd 2 Views  05/18/2015  CLINICAL DATA:  Right upper quadrant pain x2 weeks, low-grade fever EXAM: ABDOMEN - 2 VIEW COMPARISON:  None. FINDINGS: Nonobstructive bowel gas pattern. Moderate right colonic stool burden. No evidence of free air under the diaphragm on the upright view. Visualized osseous structures are within normal limits. IMPRESSION: No evidence of small bowel obstruction or free air. Moderate right colonic stool burden. Electronically Signed   By: Charline Bills M.D.   On: 05/18/2015 18:48     EKG: (Independently reviewed) sinus tachycardia ventricular rate 105 bpm, QTC 452 ms, no ST segment elevation, no voltage criteria consistent with LVH   Assessment & Plan  Principal Problem:   Acute respiratory failure/ Pleural effusion, right/ Pleuritic chest pain -Suspect patient had an upper respiratory viral illness that has now led to a parapneumonic effusion; needs diagnostic thoracentesis-low index suspicion is primarily bacterial since does not have leukocytosis or significant fevers -Admit to medical floor/Inpatient -Discussed with pulmonary medicine in recommends diagnostic thoracentesis-if effusion re-collects after large volume thoracentesis recommend consult pulmonary medicine -Check cytology including cultures, pathology, glucose as well as LDH-obtain serum LDH to help clarify if transudate versus exudative etiology -Has history of hypertension; although symptoms not typical for  heart failure will obtain echocardiogram for completeness of evaluation -Check blood cultures, HIV, sputum culture, urinary strep and Legionella -Empiric antibiotics to cover for community-acquired organisms -Influenza PCR w/ droplet precautions as well as respiratory viral panel -Supportive care with prn oxygen for sats less than 92% -Stable on room air at rest but check ambulatory oximetry  Active Problems:   HTN  -Continue preadmission ACE inhibitor    Microcytic anemia -Patient's husband states she was taking iron prior to admission -Patient endorses she continues to experience menses -Check anemia panel and TSH    Language barrier, cultural differences -Patient requires Print production planner for appropriate communication and transfer information -Patient does not yet have a primary care physician-unclear which community clinics have Arabic physicians and/or translators available in the [place] area therefore have consulted to case management to assist family in obtaining post  discharge follow-up    Hyperglycemia -Likely related to acute stress but will check hemoglobin A1c    DVT Prophylaxis: Lovenox to begin after diagnostic thoracentesis  Family Communication:   Husband at bedside  Code Status:  Full code  Condition:  Stable  Discharge disposition: Anticipate discharge back to previous home environment once medically stable  Time spent in minutes : 60      ELLIS,ALLISON L. ANP on 05/31/2015 at 8:06 AM  You may contact me by going to [email or web address] - password [email or web address]  I am available from 7a-7p but please confirm I am on the schedule by going to Amion as above.   After 7p please contact night coverage person covering me after hours  [facility]

## 2015-05-31 NOTE — Progress Notes (Addendum)
Pleural LDH 674 and serum LDH 198 and this is consistent with exudative effusion. Oral WBCs 5629 low monocytes 10% and neutrophils 23% with lymphocytes 67%. Pleural culture pending. Of note influenza PCR was negative. TSH is normal. Serum iron is very low at 11 with a ferritin of 15 and elevated B12 2061. We'll order iron for infusion [facility]) and initial dosing on 2/22.  Junious Silk, ANP

## 2015-05-31 NOTE — ED Provider Notes (Signed)
CSN: [id number]     Arrival date & time 05/30/15  1909 History   By signing my name below, I, Sheena Ramirez, attest that this documentation has been prepared under the direction and in the presence of Dione Booze, MD . Electronically Signed: Marisue Ramirez, Scribe. 05/31/2015. 4:10 AM.   Chief Complaint  Patient presents with  . Cough   The history is provided by the patient. A language interpreter was used.   HPI Comments:  Sheena Ramirez is a 45 y.o. female with PMHx of HTN and anemia referred by [facility] who presents to the Emergency Department complaining of persistent, non-productive cough for a week. She notes cough is worse when she lays on her right side. Pt reports associated shortness of breath, subjective fever, nausea, vomiting secondary to coughing, chills, right upper chest pain, right upper back pain, and right arm pain. No alleviating factors noted. Pt was evaluated last week for abdominal pain and was given medication for vomiting. She has been in the [place] for four months; she came to the [place] from [place] where she had been living for 12 years. Pt denies h/o smoking, abdominal pain, pain in legs or any other symptoms at this time.  Past Medical History  Diagnosis Date  . Hypertension   . Anemia    History reviewed. No pertinent past surgical history. No family history on file. Social History  Substance Use Topics  . Smoking status: Never Smoker   . Smokeless tobacco: None  . Alcohol Use: No   OB History    No data available     Review of Systems  Constitutional: Positive for fever and chills.  Respiratory: Positive for cough.   Cardiovascular: Positive for chest pain.  Gastrointestinal: Positive for nausea and vomiting. Negative for abdominal pain.  Musculoskeletal: Positive for back pain and arthralgias.  All other systems reviewed and are negative.  Allergies  Review of patient's allergies indicates no known allergies.  Home Medications   Prior to  Admission medications   Medication Sig Start Date End Date Taking? Authorizing Provider  lisinopril (PRINIVIL,ZESTRIL) 10 MG tablet Take 20 mg by mouth daily.    Yes Historical Provider, MD  ondansetron (ZOFRAN) 4 MG tablet Take 1 tablet (4 mg total) by mouth every 8 (eight) hours as needed for nausea or vomiting. 05/18/15  Yes Charm Rings, MD  traMADol (ULTRAM) 50 MG tablet Take 1 tablet (50 mg total) by mouth every 6 (six) hours as needed. Patient taking differently: Take 50 mg by mouth every 6 (six) hours as needed for moderate pain.  05/18/15  Yes Charm Rings, MD   BP 154/88 mmHg  Pulse 102  Temp(Src) 98.5 F (36.9 C) (Oral)  Resp 26  SpO2 100%  LMP 05/04/2015 (Exact Date) Physical Exam  Constitutional: She is oriented to person, place, and time. She appears well-developed and well-nourished.  appears moderately dyspneic, but speaks in complete sentences  HENT:  Head: Normocephalic and atraumatic.  Right Ear: Hearing normal.  Left Ear: Hearing normal.  Nose: Nose normal.  Mouth/Throat: Oropharynx is clear and moist and mucous membranes are normal.  Eyes: Conjunctivae and EOM are normal. Pupils are equal, round, and reactive to light.  Neck: Normal range of motion. Neck supple. No JVD present.  Cardiovascular: Normal rate, regular rhythm, S1 normal, S2 normal and normal heart sounds.  Exam reveals no gallop and no friction rub.   No murmur heard. Pulmonary/Chest: Effort normal. She has no wheezes. She has no  rales. She exhibits no tenderness.  Markedly decreased breath sounds in right lung  Abdominal: Soft. Normal appearance and bowel sounds are normal. She exhibits no mass. There is no hepatosplenomegaly. There is no tenderness. There is no tenderness at McBurney's point and negative Murphy's sign. No hernia.  Musculoskeletal: Normal range of motion. She exhibits no edema.  Lymphadenopathy:    She has no cervical adenopathy.  Neurological: She is alert and oriented to person,  place, and time. She has normal strength. No cranial nerve deficit or sensory deficit. She exhibits normal muscle tone. Coordination normal. GCS eye subscore is 4. GCS verbal subscore is 5. GCS motor subscore is 6.  Skin: Skin is warm, dry and intact. No rash noted. No cyanosis.  Psychiatric: She has a normal mood and affect. Her speech is normal and behavior is normal. Judgment and thought content normal.  Nursing note and vitals reviewed.   ED Course  Procedures  DIAGNOSTIC STUDIES:  Oxygen Saturation is 100% on RA, normal by my interpretation.    COORDINATION OF CARE:  3:16 AM Discussed imaging results with pt. Will order a CT. Informed pt she needs an IV for the scan and that she would most likely be admitted to the hospital. Discussed treatment plan with pt at bedside and pt agreed to plan.  Labs Review Results for orders placed or performed during the hospital encounter of 05/31/15  CBC with Differential  Result Value Ref Range   WBC 6.6 4.0 - 10.5 K/uL   RBC 3.98 3.87 - 5.11 MIL/uL   Hemoglobin 8.4 (L) 12.0 - 15.0 g/dL   HCT 08.6 (L) 57.8 - 46.9 %   MCV 70.4 (L) 78.0 - 100.0 fL   MCH 21.1 (L) 26.0 - 34.0 pg   MCHC 30.0 30.0 - 36.0 g/dL   RDW 62.9 (H) 52.8 - 41.3 %   Platelets 369 150 - 400 K/uL   Neutrophils Relative % 63 %   Lymphocytes Relative 24 %   Monocytes Relative 10 %   Eosinophils Relative 2 %   Basophils Relative 1 %   Neutro Abs 4.1 1.7 - 7.7 K/uL   Lymphs Abs 1.6 0.7 - 4.0 K/uL   Monocytes Absolute 0.7 0.1 - 1.0 K/uL   Eosinophils Absolute 0.1 0.0 - 0.7 K/uL   Basophils Absolute 0.1 0.0 - 0.1 K/uL   RBC Morphology POLYCHROMASIA PRESENT    Smear Review LARGE PLATELETS PRESENT   Basic metabolic panel  Result Value Ref Range   Sodium 139 135 - 145 mmol/L   Potassium 3.7 3.5 - 5.1 mmol/L   Chloride 103 101 - 111 mmol/L   CO2 25 22 - 32 mmol/L   Glucose, Bld 95 65 - 99 mg/dL   BUN 6 6 - 20 mg/dL   Creatinine, Ser 2.44 0.44 - 1.00 mg/dL   Calcium 8.9  8.9 - 01.0 mg/dL   GFR calc non Af Amer >60 >60 mL/min   GFR calc Af Amer >60 >60 mL/min   Anion gap 11 5 - 15    Imaging Review Dg Chest 2 View  05/30/2015  CLINICAL DATA:  Pt soes not understand english, PA had a interpater on the phone to talk with pt. Cough, shortness of breath. EXAM: CHEST - 2 VIEW COMPARISON:  05/18/2015 FINDINGS: Moderate right pleural effusion. Atelectasis/ consolidation of the right lung base some increase in right perihilar atelectasis since prior study. Left lung clear. Heart size normal. No pneumothorax. Visualized skeletal structures are unremarkable. IMPRESSION: 1. Increase  in moderate right pleural effusion, with adjacent consolidation/atelectasis. Electronically Signed   By: Corlis Leak M.D.   On: 05/30/2015 18:22   Ct Chest W Contrast  05/31/2015  CLINICAL DATA:  Acute onset of nonproductive cough, shortness of breath, subjective fever, nausea and vomiting. Chills and right upper chest pain. Right upper back pain and right arm pain. Initial encounter. EXAM: CT CHEST WITH CONTRAST TECHNIQUE: Multidetector CT imaging of the chest was performed during intravenous contrast administration. CONTRAST:  OMNIPAQUE IOHEXOL 300 MG/ML  SOLN COMPARISON:  Chest radiograph performed 05/30/2015 FINDINGS: A moderate to large right-sided pleural effusion is noted, with partial consolidation of the right lower lobe. Haziness is noted within the expanded portions of the right lung. The left lung appears relatively clear. No pneumothorax is seen. No definite masses are identified. There is mild leftward shift of the mediastinum. No definite mass is seen. The great vessels are grossly unremarkable in appearance. No mediastinal lymphadenopathy is seen. No pericardial effusion is identified. The visualized portions of thyroid gland are unremarkable. No axillary lymphadenopathy is seen. The visualized portions of the liver and spleen are unremarkable. The visualized portions of the  gallbladder, pancreas, adrenal glands and kidneys are within normal limits. No acute osseous abnormalities are identified. IMPRESSION: 1. Moderate to large right-sided pleural effusion, with partial consolidation of the right lower lobe. The pleural effusion is of uncertain etiology. Pneumonia could have such an appearance. Underlying mass cannot be excluded. Diagnostic thoracentesis could be considered for further evaluation, as deemed clinically appropriate. 2. Mild leftward shift of the mediastinum, reflecting the pleural effusion. Electronically Signed   By: Roanna Raider M.D.   On: 05/31/2015 05:07   I have personally reviewed and evaluated these images and lab results as part of my medical decision-making.   MDM   Final diagnoses:  Pleural effusion, right  Community acquired pneumonia  Microcytic hypochromic anemia    Patient with them right-sided chest pain and dyspnea with x-ray showing large right pleural effusion. Old records are reviewed and she had been sent here from urgent care. She was seen at urgent care for some epigastric discomfort on February 8 and was given prescriptions for tramadol and ondansetron. X-rays were done at that time showing no pleural effusion. Laboratory workup shows microcytic anemia which is probably iron deficiency. No prior labs are available for comparison but this does appear to be chronic. CT scan confirms large pleural effusion with some consolidation of the right lower lobe which could possibly represent pneumonia. She is started on antibiotics for community-acquired pneumonia-ceftriaxone and azithromycin. Please note, that the diagnosis of pneumonia was not obvious on presentation which accounts for the delay in starting antibiotics. Case is discussed with Dr. Toniann Fail [facility] who agrees to admit the patient.  I personally performed the services described in this documentation, which was scribed in my presence. The recorded information has  been reviewed and is accurate.      Dione Booze, MD 05/31/15 [phone]

## 2015-06-01 ENCOUNTER — Inpatient Hospital Stay (HOSPITAL_COMMUNITY): Payer: Medicaid Other

## 2015-06-01 DIAGNOSIS — R0600 Dyspnea, unspecified: Secondary | ICD-10-CM

## 2015-06-01 DIAGNOSIS — R509 Fever, unspecified: Secondary | ICD-10-CM | POA: Insufficient documentation

## 2015-06-01 DIAGNOSIS — J189 Pneumonia, unspecified organism: Secondary | ICD-10-CM | POA: Insufficient documentation

## 2015-06-01 DIAGNOSIS — J90 Pleural effusion, not elsewhere classified: Secondary | ICD-10-CM | POA: Insufficient documentation

## 2015-06-01 DIAGNOSIS — Z9889 Other specified postprocedural states: Secondary | ICD-10-CM | POA: Insufficient documentation

## 2015-06-01 DIAGNOSIS — I10 Essential (primary) hypertension: Secondary | ICD-10-CM

## 2015-06-01 DIAGNOSIS — D509 Iron deficiency anemia, unspecified: Secondary | ICD-10-CM

## 2015-06-01 DIAGNOSIS — M25511 Pain in right shoulder: Secondary | ICD-10-CM

## 2015-06-01 DIAGNOSIS — J948 Other specified pleural conditions: Secondary | ICD-10-CM

## 2015-06-01 LAB — COMPREHENSIVE METABOLIC PANEL
ALT: 16 U/L (ref 14–54)
AST: 15 U/L (ref 15–41)
Albumin: 2.5 g/dL — ABNORMAL LOW (ref 3.5–5.0)
Alkaline Phosphatase: 82 U/L (ref 38–126)
Anion gap: 13 (ref 5–15)
CHLORIDE: 103 mmol/L (ref 101–111)
CO2: 26 mmol/L (ref 22–32)
CREATININE: 0.58 mg/dL (ref 0.44–1.00)
Calcium: 8.8 mg/dL — ABNORMAL LOW (ref 8.9–10.3)
GFR calc Af Amer: >60 mL/min (ref >60)
Glucose, Bld: 108 mg/dL — ABNORMAL HIGH (ref 65–99)
Potassium: 3.9 mmol/L (ref 3.5–5.1)
Sodium: 142 mmol/L (ref 135–145)
Total Bilirubin: 0.2 mg/dL — ABNORMAL LOW (ref 0.3–1.2)
Total Protein: 7 g/dL (ref 6.5–8.1)

## 2015-06-01 LAB — CBC WITH DIFFERENTIAL/PLATELET
BASOS PCT: 0 %
Basophils Absolute: 0 10*3/uL (ref 0.0–0.1)
EOS ABS: 0 10*3/uL (ref 0.0–0.7)
EOS PCT: 1 %
HCT: 26.9 % — ABNORMAL LOW (ref 36.0–46.0)
HEMOGLOBIN: 8 g/dL — AB (ref 12.0–15.0)
LYMPHS ABS: 1.3 10*3/uL (ref 0.7–4.0)
Lymphocytes Relative: 28 %
MCH: 21.1 pg — AB (ref 26.0–34.0)
MCHC: 29.7 g/dL — ABNORMAL LOW (ref 30.0–36.0)
MCV: 70.8 fL — AB (ref 78.0–100.0)
MONO ABS: 0.6 10*3/uL (ref 0.1–1.0)
Monocytes Relative: 13 %
Neutro Abs: 2.7 10*3/uL (ref 1.7–7.7)
Neutrophils Relative %: 58 %
Platelets: 329 10*3/uL (ref 150–400)
RBC: 3.8 MIL/uL — ABNORMAL LOW (ref 3.87–5.11)
RDW: 17.3 % — ABNORMAL HIGH (ref 11.5–15.5)
WBC: 4.6 10*3/uL (ref 4.0–10.5)

## 2015-06-01 LAB — MISC LABCORP TEST (SEND OUT): Labcorp test code: 88062

## 2015-06-01 LAB — EXPECTORATED SPUTUM ASSESSMENT W REFEX TO RESP CULTURE

## 2015-06-01 LAB — EXPECTORATED SPUTUM ASSESSMENT W GRAM STAIN, RFLX TO RESP C

## 2015-06-01 LAB — PH, BODY FLUID: pH, Body Fluid: 7.6

## 2015-06-01 LAB — HIV ANTIBODY (ROUTINE TESTING W REFLEX): HIV SCREEN 4TH GENERATION: NONREACTIVE

## 2015-06-01 LAB — HEMOGLOBIN A1C
Hgb A1c MFr Bld: 6.2 % — ABNORMAL HIGH (ref 4.8–5.6)
MEAN PLASMA GLUCOSE: 131 mg/dL

## 2015-06-01 LAB — STREP PNEUMONIAE URINARY ANTIGEN: Strep Pneumo Urinary Antigen: NEGATIVE

## 2015-06-01 MED ORDER — SODIUM CHLORIDE 0.9 % IV SOLN
510.0000 mg | INTRAVENOUS | Status: DC
  Administered 2015-06-01: 510 mg via INTRAVENOUS
  Filled 2015-06-01: qty 17

## 2015-06-01 MED ORDER — ACETAMINOPHEN 325 MG PO TABS
650.0000 mg | ORAL_TABLET | Freq: Four times a day (QID) | ORAL | Status: DC | PRN
  Administered 2015-06-01 – 2015-06-04 (×2): 650 mg via ORAL
  Filled 2015-06-01 (×2): qty 2

## 2015-06-01 NOTE — Care Management Note (Signed)
Case Management Note  Patient Details  Name: Arienne Gartin MRN: [id number] Date of Birth: Mar 31, 1971  Subjective/Objective:                    Action/Plan:   Expected Discharge Date:                  Expected Discharge Plan:  Home/Self Care  In-House Referral:     Discharge planning Services  CM Consult, [facility]  Post Acute Care Choice:    Choice offered to:     DME Arranged:    DME Agency:     HH Arranged:    HH Agency:     Status of Service:  In process, will continue to follow  Medicare Important Message Given:    Date Medicare IM Given:    Medicare IM give by:    Date Additional Medicare IM Given:    Additional Medicare Important Message give by:     If discussed at Long Length of Stay Meetings, dates discussed:    Additional Comments:  Epifanio Lesches, RN 06/01/2015, 3:18 PM

## 2015-06-01 NOTE — Progress Notes (Signed)
UR COMPLETED  

## 2015-06-01 NOTE — Consult Note (Signed)
Date of Admission:  05/31/2015  Date of Consult:  06/01/2015  Reason for Consult: concern for TB pleural effusion Referring Physician: Dr. Cruzita Lederer   HPI:  Sheena Ramirez is an 45 y.o. female who immigrated to the [place] from [place] approximately 4 months ago. She had an seen in the emergency department on February 8 with some right sided abdominal pain. At that time she was reporting intermittent fevers, and pain radiating up to her right shoulder. That time apparently she had a positive Murphy sign with pain with the patella palpation a rapid quadrant she was thought to have potential cholecystitis and was treated symptomatically. Apparently they did not think she had cholangitis as she was not prescribed antibiotics. She then returned to the emerge department on February 20 with persistent pain and now with cough and persistent fevers as well. She was having exertional dyspnea on exertion as well. She is found to have a significant right-sided pleural effusion on x-ray on the 20th. CT scan was performed which showed a moderate to large sized pleural effusion on the right causing tracheal deviation left words and with consolidation of the right lower lobe. She was started on ceftriaxone and azithromycin. IR performed a thoracocentesis and 1.3 L of greenish fluid was removed.   Pleural fluid was exudative with pleural throat fluid protein of 5.6 LDH of 676 white blood cells of 5629 with a lymphocytic predominance with 67% lymphocytes 23% neutrophils and 10% monocytes. Pleural fluid glucose was 80.  Pleural fluid cultures are without growth after 1 day.  After initial admission it was found by further investigation of the patient had actually been feeling unwell for nearly a year. She apparently been suffering for night sweats during this time. 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  In addition over the last 4-8 weeks she was having worsening dyspnea on exertion and cough which is largely been nonproductive and fevers.  The only person in their family with tuberculosis or close contacts is the grandmother of her husband who did suffer them tuberculosis otherwise there is not a specific known tuberculosis contacts.      Past Medical History  Diagnosis Date  . Hypertension   . Anemia   . Pleural effusion 05/2015    Past Surgical History  Procedure Laterality Date  . No past surgeries      Social History:  reports that she has never smoked. She has never used smokeless tobacco. She reports that she does not drink alcohol or use illicit drugs.   Family History  Problem Relation Age of Onset  . Anemia Mother   . Heart disease Mother     No Known Allergies   Medications: I have reviewed patients current medications as documented in Epic Anti-infectives    Start     Dose/Rate Route Frequency Ordered Stop   06/01/15 0600  azithromycin (ZITHROMAX) 500 mg in dextrose 5 % 250 mL IVPB     500 mg 250 mL/hr over 60 Minutes Intravenous Every 24 hours 05/31/15 1229 06/08/15 0559   06/01/15 0600  cefTRIAXone (ROCEPHIN) 1 g in dextrose 5 % 50 mL IVPB     1 g 100 mL/hr over 30 Minutes Intravenous Every 24 hours 05/31/15 1229 06/08/15 0559   05/31/15 1230  cefTRIAXone (ROCEPHIN) 1 g in dextrose 5 %  50 mL IVPB  Status:  Discontinued     1 g 100 mL/hr over 30 Minutes Intravenous Every 24 hours 05/31/15 1221 05/31/15 1229   05/31/15 1230  azithromycin (ZITHROMAX) 500 mg in dextrose 5 % 250 mL IVPB  Status:  Discontinued     500 mg 250 mL/hr over 60 Minutes Intravenous Every 24 hours 05/31/15 1221 05/31/15 1229   05/31/15 0530  cefTRIAXone (ROCEPHIN) 1 g in dextrose 5 % 50 mL IVPB     1 g 100 mL/hr over 30 Minutes Intravenous  Once 05/31/15 0515 05/31/15 0609   05/31/15 0530  azithromycin (ZITHROMAX)  500 mg in dextrose 5 % 250 mL IVPB     500 mg 250 mL/hr over 60 Minutes Intravenous  Once 05/31/15 0515 05/31/15 0711         ROS:  as in HPI otherwise remainder of 12 point Review of Systems is negative   Blood pressure 147/61, pulse 79, temperature 98.8 F (37.1 C), temperature source Oral, resp. rate 18, weight 246 lb 11.1 oz (111.9 kg), last menstrual period 05/26/2015, SpO2 100 %. General: Alert and awake, oriented x3, not in any acute distress. HEENT: anicteric sclera,  EOMI, oropharynx clear and without exudate Cardiovascular: tegular rate, normal r,  no murmur rubs or gallops Pulmonary: Diminished breath sounds in the right side with dullness to percussion.  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 Neuro: nonfocal, strength and sensation intact   Results for orders placed or performed during the hospital encounter of 05/31/15 (from the past 48 hour(s))  CBC with Differential     Status: Abnormal   Collection Time: 05/30/15  8:07 PM  Result Value Ref Range   WBC 6.6 4.0 - 10.5 K/uL   RBC 3.98 3.87 - 5.11 MIL/uL   Hemoglobin 8.4 (L) 12.0 - 15.0 g/dL   HCT 28.0 (L) 36.0 - 46.0 %   MCV 70.4 (L) 78.0 - 100.0 fL   MCH 21.1 (L) 26.0 - 34.0 pg   MCHC 30.0 30.0 - 36.0 g/dL   RDW 17.3 (H) 11.5 - 15.5 %   Platelets 369 150 - 400 K/uL   Neutrophils Relative % 63 %   Lymphocytes Relative 24 %   Monocytes Relative 10 %   Eosinophils Relative 2 %   Basophils Relative 1 %   Neutro Abs 4.1 1.7 - 7.7 K/uL   Lymphs Abs 1.6 0.7 - 4.0 K/uL   Monocytes Absolute 0.7 0.1 - 1.0 K/uL   Eosinophils Absolute 0.1 0.0 - 0.7 K/uL   Basophils Absolute 0.1 0.0 - 0.1 K/uL   RBC Morphology POLYCHROMASIA PRESENT     Comment:  ELLIPTOCYTES   Smear Review LARGE PLATELETS PRESENT   Basic metabolic panel     Status: None   Collection Time: 05/30/15  8:07 PM  Result Value Ref Range   Sodium 139 135 - 145 mmol/L   Potassium 3.7 3.5 - 5.1 mmol/L   Chloride 103 101 - 111 mmol/L   CO2 25 22 - 32 mmol/L   Glucose, Bld 95 65 - 99 mg/dL   BUN 6 6 - 20 mg/dL   Creatinine, Ser 0.56 0.44 - 1.00 mg/dL   Calcium 8.9 8.9 - 10.3 mg/dL   GFR calc non Af Amer >60 >60 mL/min  GFR calc Af Amer >60 >60 mL/min    Comment: (NOTE) The eGFR has been calculated using the CKD EPI equation. This calculation has not been validated in all clinical situations. eGFR's persistently <60 mL/min signify possible Chronic Kidney Disease.    Anion gap 11 5 - 15  Culture, blood (routine x 2)     Status: None (Preliminary result)   Collection Time: 05/31/15  7:52 AM  Result Value Ref Range   Specimen Description BLOOD LEFT ANTECUBITAL    Special Requests BOTTLES DRAWN AEROBIC AND ANAEROBIC 10CC    Culture NO GROWTH 1 DAY    Report Status PENDING   Culture, blood (routine x 2)     Status: None (Preliminary result)   Collection Time: 05/31/15  8:02 AM  Result Value Ref Range   Specimen Description BLOOD LEFT HAND    Special Requests BOTTLES DRAWN AEROBIC ONLY 10CC    Culture NO GROWTH 1 DAY    Report Status PENDING   Vitamin B12     Status: Abnormal   Collection Time: 05/31/15  8:07 AM  Result Value Ref Range   Vitamin B-12 2061 (H) 180 - 914 pg/mL    Comment: (NOTE) This assay is not validated for testing neonatal or myeloproliferative syndrome specimens for Vitamin B12 levels.   Folate     Status: None   Collection Time: 05/31/15  8:07 AM  Result Value Ref Range   Folate 28.8 >5.9 ng/mL  Iron and TIBC     Status: Abnormal   Collection Time: 05/31/15  8:07 AM  Result Value Ref Range   Iron 11 (L) 28 - 170 ug/dL   TIBC 294 250 - 450 ug/dL   Saturation Ratios 4 (L) 10.4 - 31.8 %   UIBC 283 ug/dL  Ferritin     Status: None    Collection Time: 05/31/15  8:07 AM  Result Value Ref Range   Ferritin 15 11 - 307 ng/mL  Reticulocytes     Status: None   Collection Time: 05/31/15  8:07 AM  Result Value Ref Range   Retic Ct Pct 1.3 0.4 - 3.1 %   RBC. 3.88 3.87 - 5.11 MIL/uL   Retic Count, Manual 50.4 19.0 - 186.0 K/uL  TSH     Status: None   Collection Time: 05/31/15  8:07 AM  Result Value Ref Range   TSH 1.269 0.350 - 4.500 uIU/mL  Influenza panel by PCR (type A & B, H1N1)     Status: None   Collection Time: 05/31/15  8:27 AM  Result Value Ref Range   Influenza A By PCR NEGATIVE NEGATIVE   Influenza B By PCR NEGATIVE NEGATIVE   H1N1 flu by pcr NOT DETECTED NOT DETECTED    Comment:        The Xpert Flu assay (FDA approved for nasal aspirates or washes and nasopharyngeal swab specimens), is intended as an aid in the diagnosis of influenza and should not be used as a sole basis for treatment.   Lactate dehydrogenase (CSF, pleural or peritoneal fluid)     Status: Abnormal   Collection Time: 05/31/15  9:29 AM  Result Value Ref Range   LD, Fluid 674 (H) 3 - 23 U/L    Comment: (NOTE) Results should be evaluated in conjunction with serum values    Fluid Type-FLDH Pleural R   Protein, pleural or peritoneal fluid     Status: None   Collection Time: 05/31/15  9:29 AM  Result Value Ref Range  Total protein, fluid 5.6 g/dL    Comment: (NOTE) No normal range established for this test Results should be evaluated in conjunction with serum values    Fluid Type-FTP Pleural R   Body fluid cell count with differential     Status: Abnormal   Collection Time: 05/31/15  9:29 AM  Result Value Ref Range   Fluid Type-FCT Pleural R    Color, Fluid YELLOW (A) YELLOW   Appearance, Fluid HAZY (A) CLEAR   WBC, Fluid 5629 (H) 0 - 1000 cu mm   Neutrophil Count, Fluid 23 0 - 25 %   Lymphs, Fluid 67 %   Monocyte-Macrophage-Serous Fluid 10 (L) 50 - 90 %  Glucose, pleural or peritoneal fluid     Status: None   Collection Time:  05/31/15  9:29 AM  Result Value Ref Range   Glucose, Fluid 80 mg/dL    Comment: (NOTE) No normal range established for this test Results should be evaluated in conjunction with serum values    Fluid Type-FGLU Pleural R   Miscellaneous [facility] test (send-out)     Status: None   Collection Time: 05/31/15  9:29 AM  Result Value Ref Range   [facility] test code [id number]    [facility] test name AMYLASE    Source ([facility]) 1ML PLEURAL FLUID RIGHT ROOM TEMP    Misc [facility] result COMMENT     Comment: (NOTE) Test Ordered: [id number] Amylase, Body Fluid Amylase, Body Fluid            48               U/L      BN   ________________________________________________________ :  Peritoneal  :       Pleural          :   Synovial     : :______________:________________________:________________: :              : Transudate :  Exudate  :                : :______________:____________:___________:________________: :  88-109 U/L  : Not Estab. : Not Estab.:   Not Estab.   : :______________:____________:___________:________________: The method performance specifications have not been established for this test in body fluid. The test result should be integrated into the clinical context for interpretation. The method performance specifications have not been established for this test in body fluid.  The test result should be integrated into the clinical context for interpretation. Performed At: [facility] [address] [place], [place] [id number] Lindon Romp MD [id number]   Culture, body fluid-bottle     Status: None (Preliminary result)   Collection Time: 05/31/15  9:29 AM  Result Value Ref Range   Specimen Description FLUID PLEURAL RIGHT    Special Requests NONE    Culture NO GROWTH 1 DAY    Report Status PENDING   Gram stain     Status: None   Collection Time: 05/31/15  9:29 AM  Result Value Ref Range   Specimen Description FLUID PLEURAL RIGHT    Special Requests NONE    Gram Stain        FEW WBC PRESENT, PREDOMINANTLY PMN NO ORGANISMS SEEN    Report Status 05/31/2015 FINAL   Lactate dehydrogenase     Status: Abnormal   Collection Time: 05/31/15 10:03 AM  Result Value Ref Range   LDH 198 (H) 98 - 192 U/L  HIV antibody     Status: None  Collection Time: 05/31/15  1:15 PM  Result Value Ref Range   HIV Screen 4th Generation wRfx Non Reactive Non Reactive    Comment: (NOTE) Performed At: [facility] [address] [place], [place] [id number] Lindon Romp MD [id number]   Hemoglobin A1c     Status: Abnormal   Collection Time: 05/31/15  1:15 PM  Result Value Ref Range   Hgb A1c MFr Bld 6.2 (H) 4.8 - 5.6 %    Comment: (NOTE)         Pre-diabetes: 5.7 - 6.4         Diabetes: >6.4         Glycemic control for adults with diabetes: <7.0    Mean Plasma Glucose 131 mg/dL    Comment: (NOTE) Performed At: [facility] [address] [place], [place] [id number] Lindon Romp MD [id number]   Culture, sputum-assessment     Status: None   Collection Time: 06/01/15  6:57 AM  Result Value Ref Range   Specimen Description SPUTUM    Special Requests NONE    Sputum evaluation      MICROSCOPIC FINDINGS SUGGEST THAT THIS SPECIMEN IS NOT REPRESENTATIVE OF LOWER RESPIRATORY SECRETIONS. PLEASE RECOLLECT. Gram Stain Report Called to,Read Back By and Verified With: K PRICE,RN AT [id number] 06/01/15 BY L BENFIELD    Report Status 06/01/2015 FINAL   Comprehensive metabolic panel     Status: Abnormal   Collection Time: 06/01/15  7:17 AM  Result Value Ref Range   Sodium 142 135 - 145 mmol/L   Potassium 3.9 3.5 - 5.1 mmol/L   Chloride 103 101 - 111 mmol/L   CO2 26 22 - 32 mmol/L   Glucose, Bld 108 (H) 65 - 99 mg/dL   BUN <5 (L) 6 - 20 mg/dL   Creatinine, Ser 0.58 0.44 - 1.00 mg/dL   Calcium 8.8 (L) 8.9 - 10.3 mg/dL   Total Protein 7.0 6.5 - 8.1 g/dL   Albumin 2.5 (L) 3.5 - 5.0 g/dL   AST 15 15 - 41 U/L   ALT 16 14 - 54 U/L   Alkaline Phosphatase 82 38  - 126 U/L   Total Bilirubin 0.2 (L) 0.3 - 1.2 mg/dL   GFR calc non Af Amer >60 >60 mL/min   GFR calc Af Amer >60 >60 mL/min    Comment: (NOTE) The eGFR has been calculated using the CKD EPI equation. This calculation has not been validated in all clinical situations. eGFR's persistently <60 mL/min signify possible Chronic Kidney Disease.    Anion gap 13 5 - 15  CBC WITH DIFFERENTIAL     Status: Abnormal   Collection Time: 06/01/15  7:17 AM  Result Value Ref Range   WBC 4.6 4.0 - 10.5 K/uL   RBC 3.80 (L) 3.87 - 5.11 MIL/uL   Hemoglobin 8.0 (L) 12.0 - 15.0 g/dL   HCT 26.9 (L) 36.0 - 46.0 %   MCV 70.8 (L) 78.0 - 100.0 fL   MCH 21.1 (L) 26.0 - 34.0 pg   MCHC 29.7 (L) 30.0 - 36.0 g/dL   RDW 17.3 (H) 11.5 - 15.5 %   Platelets 329 150 - 400 K/uL   Neutrophils Relative % 58 %   Lymphocytes Relative 28 %   Monocytes Relative 13 %   Eosinophils Relative 1 %   Basophils Relative 0 %   Neutro Abs 2.7 1.7 - 7.7 K/uL   Lymphs Abs 1.3 0.7 - 4.0 K/uL   Monocytes Absolute 0.6 0.1 -  1.0 K/uL   Eosinophils Absolute 0.0 0.0 - 0.7 K/uL   Basophils Absolute 0.0 0.0 - 0.1 K/uL   RBC Morphology POLYCHROMASIA PRESENT   Strep pneumoniae urinary antigen     Status: None   Collection Time: 06/01/15 10:06 AM  Result Value Ref Range   Strep Pneumo Urinary Antigen NEGATIVE NEGATIVE    Comment:        Infection due to S. pneumoniae cannot be absolutely ruled out since the antigen present may be below the detection limit of the test.   Culture, expectorated sputum-assessment     Status: None   Collection Time: 06/01/15 12:01 PM  Result Value Ref Range   Specimen Description SPUTUM    Special Requests NONE    Sputum evaluation      MICROSCOPIC FINDINGS SUGGEST THAT THIS SPECIMEN IS NOT REPRESENTATIVE OF LOWER RESPIRATORY SECRETIONS. PLEASE RECOLLECT. RESULT CALLED TO, READ BACK BY AND VERIFIED WITH: K PRICE 06/01/15 @ 1234 M VESTAL    Report Status 06/01/2015 FINAL     _0 (sdes,specrequest,cult,reptstatus)   ) Recent Results (from the past 720 hour(s))  Culture, blood (routine x 2)     Status: None (Preliminary result)   Collection Time: 05/31/15  7:52 AM  Result Value Ref Range Status   Specimen Description BLOOD LEFT ANTECUBITAL  Final   Special Requests BOTTLES DRAWN AEROBIC AND ANAEROBIC 10CC  Final   Culture NO GROWTH 1 DAY  Final   Report Status PENDING  Incomplete  Culture, blood (routine x 2)     Status: None (Preliminary result)   Collection Time: 05/31/15  8:02 AM  Result Value Ref Range Status   Specimen Description BLOOD LEFT HAND  Final   Special Requests BOTTLES DRAWN AEROBIC ONLY 10CC  Final   Culture NO GROWTH 1 DAY  Final   Report Status PENDING  Incomplete  Culture, body fluid-bottle     Status: None (Preliminary result)   Collection Time: 05/31/15  9:29 AM  Result Value Ref Range Status   Specimen Description FLUID PLEURAL RIGHT  Final   Special Requests NONE  Final   Culture NO GROWTH 1 DAY  Final   Report Status PENDING  Incomplete  Gram stain     Status: None   Collection Time: 05/31/15  9:29 AM  Result Value Ref Range Status   Specimen Description FLUID PLEURAL RIGHT  Final   Special Requests NONE  Final   Gram Stain   Final    FEW WBC PRESENT, PREDOMINANTLY PMN NO ORGANISMS SEEN    Report Status 05/31/2015 FINAL  Final  Culture, sputum-assessment     Status: None   Collection Time: 06/01/15  6:57 AM  Result Value Ref Range Status   Specimen Description SPUTUM  Final   Special Requests NONE  Final   Sputum evaluation   Final    MICROSCOPIC FINDINGS SUGGEST THAT THIS SPECIMEN IS NOT REPRESENTATIVE OF LOWER RESPIRATORY SECRETIONS. PLEASE RECOLLECT. Gram Stain Report Called to,Read Back By and Verified With: K PRICE,RN AT [id number] 06/01/15 BY L BENFIELD    Report Status 06/01/2015 FINAL  Final  Culture, expectorated sputum-assessment     Status: None   Collection Time: 06/01/15 12:01 PM  Result Value Ref  Range Status   Specimen Description SPUTUM  Final   Special Requests NONE  Final   Sputum evaluation   Final    MICROSCOPIC FINDINGS SUGGEST THAT THIS SPECIMEN IS NOT REPRESENTATIVE OF LOWER RESPIRATORY SECRETIONS. PLEASE RECOLLECT. RESULT CALLED TO, READ BACK BY  AND VERIFIED WITH: K PRICE 06/01/15 @ 1234 M VESTAL    Report Status 06/01/2015 FINAL  Final     Impression/Recommendation  Principal Problem:   Acute respiratory failure (HCC) Active Problems:   Pleural effusion, right   Pleuritic chest pain   HTN (hypertension)   Microcytic anemia   Language barrier, cultural differences   Hyperglycemia   Liba Hulsey is a 45 y.o. female of [place] origin with year long hx of night sweats, prior shoulder pain treated we believe with injectable steroids, then several week to possible 2 month hx of abdominal pain, with aggressive dyspnea on exertion and fevers now found to have a very large right-sided pleural effusion with right lower lobe consolidation. Pleural fluid characteristics are consistent with an exudative pleural effusion though not with an empyema. There is concern rightfully so for possible tuberculosis effusion.  #1 Exudative pleural effusion with concerns for possible tuberculosis effusion  --she needs a repeat large volume thoracocentesis with pleural fluid sent for  --ADENOSINE DE-AMINASE --MTB BY PCR --SHE SHOULD HAVE LARGE VOLUME SENT FOR AFB STAIN AND CULTURE  --I would also send fluid for fungal culture and  --fluid for cytology  --she needs at least THREE AFB sputa collected separated by 8 hours  --if she not produce sputum that I would get respiratory therapy to induce sputum for culture.  --Maintain negative airborne precautions even if AFB sputum are negative until we have come to a decision about whether she has tuberculosis that is in need of treatment or not.  --She may need help from pulmonary for possible pleural-based biopsy because  first UA can find out from the fluid.  --I would get a Quantiferon gold though if it is negative would not exclude TB --HIV test needs to be done --stop her azithromycin to increase yield on AFB cultures --ok to continue the ceftriaxone for now  #2 Shoulder pain: likely referred pain from her pleural effusion which may have gone back many months therefore. Consider imaging of shoulder but at this point not overwhelmed by exam of shoulder  I spent greater than 70 nutes with the patient including greater than 50% of time in face to face counsel of the patient andher husband using an Fish farm manager in person and in coordination of her care       06/01/2015, 3:09 PM   Thank you so much for this interesting consult  [place] [facility] [place] [phone] (pager) [phone] (office) 06/01/2015, 3:09 PM  Rhina [place] 06/01/2015, 3:09 PM

## 2015-06-01 NOTE — Progress Notes (Signed)
PROGRESS NOTE  Sheena Ramirez WUJ:[id number] DOB: [birth year] DOA: 05/31/2015 PCP: No primary care provider on file. Outpatient Specialists:    LOS: 1 day   Brief Narrative: 45 year old female with history of hypertension, anemia, was admitted on 2/21 with shortness of breath, found to have a right pleural effusion.  Assessment & Plan: Principal Problem:   Acute respiratory failure (HCC) Active Problems:   Pleural effusion, right   Pleuritic chest pain   HTN (hypertension)   Microcytic anemia   Language barrier, cultural differences   Hyperglycemia   Right-sided pleural effusion/pleuritic chest pain with possible community-acquired pneumonia - Patient underwent thoracentesis on 2/21, fluid analysis appears to be exudate - She endorses subjective fevers and night sweats as well as cough for the past 12 months. She denies any contacts with tuberculosis, however her mother-in-law had TB that was treated in the 37s.  - We'll need to rule out tuberculosis given systemic symptoms as well as indolent onset of shortness of breath, I consulted infectious disease today, discussed with Dr. Daiva Eves, who will see patient in consult, appreciate input - HIV negative - Continue ceftriaxone and azithromycin for now  Hypertension - Continue oral medications with her home lisinopril, blood pressure 147/61 today  Microcytic anemia - Anemia panel shows iron deficiency anemia, status post Feraheme IV on 2/22 - Prescribed iron supplements on discharge  Prediabetes - Hemoglobin A1c of 6.2 - Will probably need metformin on discharge   DVT prophylaxis: Lovenox Code Status: Full Family Communication: d/w husband bedside Disposition Plan: home when ready Barriers for discharge: TB r/o  Consultants:   ID  Procedures:   2D echo:  Study Conclusions - Left ventricle: The cavity size was normal. Systolic function was normal. The estimated ejection fraction was in the range of 55%to  60%. Wall motion was normal; there were no regional wallmotion abnormalities. - Mitral valve: There was trivial regurgitation. - Tricuspid valve: There was trivial regurgitation.  Antimicrobials:  Ceftriaxone 2/21 >>  Azithromycin 2/21 >>   Subjective: - She feels today that her breathing has significantly improved. Interpreter at bedside and helps with translating from Arabic. She endorses mild residual pleuritic type pain on the right. She is endorsing night sweats, coughing for the past 12 months. She endorses a 4 pound weight loss over the last 3 weeks  Objective: Filed Vitals:   06/01/15 1000 06/01/15 1217 06/01/15 1315 06/01/15 1442  BP: 144/74 142/88 142/67 147/61  Pulse: 85 85 79   Temp:  99 F (37.2 C) 98.8 F (37.1 C)   TempSrc:  Oral Oral   Resp:      Weight:      SpO2:       No intake or output data in the 24 hours ending 06/01/15 1541 Filed Weights   05/31/15 1607  Weight: 111.9 kg (246 lb 11.1 oz)    Examination: BP 147/61 mmHg  Pulse 79  Temp(Src) 98.8 F (37.1 C) (Oral)  Resp 18  Wt 111.9 kg (246 lb 11.1 oz)  SpO2 100%  LMP 05/26/2015 General exam: NAD Respiratory system: Clear. No increased work of breathing. No wheezing, no crackles Cardiovascular system: regular rate and rhythm, no murmurs, gallops. No JVD. No peripheral edema.  Gastrointestinal system: Abdomen is nondistended, soft and nontender. Normal bowel sounds heard. Central nervous system: AxOx3. No focal deficits Extremities: No clubbing/cyanosis Skin: no rashes  Data Reviewed: I have personally reviewed following labs and imaging studies  CBC:  Recent Labs Lab 05/30/15 2007 06/01/15 9562  WBC 6.6 4.6  NEUTROABS 4.1 2.7  HGB 8.4* 8.0*  HCT 28.0* 26.9*  MCV 70.4* 70.8*  PLT 369 329   Basic Metabolic Panel:  Recent Labs Lab 05/30/15 2007 06/01/15 0717  NA 139 142  K 3.7 3.9  CL 103 103  CO2 25 26  GLUCOSE 95 108*  BUN 6 <5*  CREATININE 0.56 0.58  CALCIUM 8.9 8.8*     GFR: CrCl cannot be calculated (Unknown ideal weight.). Liver Function Tests:  Recent Labs Lab 06/01/15 0717  AST 15  ALT 16  ALKPHOS 82  BILITOT 0.2*  PROT 7.0  ALBUMIN 2.5*   No results for input(s): LIPASE, AMYLASE in the last 168 hours. No results for input(s): AMMONIA in the last 168 hours. Coagulation Profile: No results for input(s): INR, PROTIME in the last 168 hours. Cardiac Enzymes: No results for input(s): CKTOTAL, CKMB, CKMBINDEX, TROPONINI in the last 168 hours. BNP (last 3 results) No results for input(s): PROBNP in the last 8760 hours. HbA1C:  Recent Labs  05/31/15 1315  HGBA1C 6.2*   CBG: No results for input(s): GLUCAP in the last 168 hours. Lipid Profile: No results for input(s): CHOL, HDL, LDLCALC, TRIG, CHOLHDL, LDLDIRECT in the last 72 hours. Thyroid Function Tests:  Recent Labs  05/31/15 0807  TSH 1.269   Anemia Panel:  Recent Labs  05/31/15 0807  VITAMINB12 2061*  FOLATE 28.8  FERRITIN 15  TIBC 294  IRON 11*  RETICCTPCT 1.3   Urine analysis: No results found for: COLORURINE, APPEARANCEUR, LABSPEC, PHURINE, GLUCOSEU, HGBUR, BILIRUBINUR, KETONESUR, PROTEINUR, UROBILINOGEN, NITRITE, LEUKOCYTESUR Sepsis Labs: Invalid input(s): PROCALCITONIN, LACTICIDVEN  Recent Results (from the past 240 hour(s))  Culture, blood (routine x 2)     Status: None (Preliminary result)   Collection Time: 05/31/15  7:52 AM  Result Value Ref Range Status   Specimen Description BLOOD LEFT ANTECUBITAL  Final   Special Requests BOTTLES DRAWN AEROBIC AND ANAEROBIC 10CC  Final   Culture NO GROWTH 1 DAY  Final   Report Status PENDING  Incomplete  Culture, blood (routine x 2)     Status: None (Preliminary result)   Collection Time: 05/31/15  8:02 AM  Result Value Ref Range Status   Specimen Description BLOOD LEFT HAND  Final   Special Requests BOTTLES DRAWN AEROBIC ONLY 10CC  Final   Culture NO GROWTH 1 DAY  Final   Report Status PENDING  Incomplete   Culture, body fluid-bottle     Status: None (Preliminary result)   Collection Time: 05/31/15  9:29 AM  Result Value Ref Range Status   Specimen Description FLUID PLEURAL RIGHT  Final   Special Requests NONE  Final   Culture NO GROWTH 1 DAY  Final   Report Status PENDING  Incomplete  Gram stain     Status: None   Collection Time: 05/31/15  9:29 AM  Result Value Ref Range Status   Specimen Description FLUID PLEURAL RIGHT  Final   Special Requests NONE  Final   Gram Stain   Final    FEW WBC PRESENT, PREDOMINANTLY PMN NO ORGANISMS SEEN    Report Status 05/31/2015 FINAL  Final  Culture, sputum-assessment     Status: None   Collection Time: 06/01/15  6:57 AM  Result Value Ref Range Status   Specimen Description SPUTUM  Final   Special Requests NONE  Final   Sputum evaluation   Final    MICROSCOPIC FINDINGS SUGGEST THAT THIS SPECIMEN IS NOT REPRESENTATIVE OF LOWER RESPIRATORY SECRETIONS.  PLEASE RECOLLECT. Gram Stain Report Called to,Read Back By and Verified With: K PRICE,RN AT 2130 06/01/15 BY L BENFIELD    Report Status 06/01/2015 FINAL  Final  Culture, expectorated sputum-assessment     Status: None   Collection Time: 06/01/15 12:01 PM  Result Value Ref Range Status   Specimen Description SPUTUM  Final   Special Requests NONE  Final   Sputum evaluation   Final    MICROSCOPIC FINDINGS SUGGEST THAT THIS SPECIMEN IS NOT REPRESENTATIVE OF LOWER RESPIRATORY SECRETIONS. PLEASE RECOLLECT. RESULT CALLED TO, READ BACK BY AND VERIFIED WITH: K PRICE 06/01/15 @ 1234 M VESTAL    Report Status 06/01/2015 FINAL  Final      Radiology Studies: Dg Chest 1 View  05/31/2015  CLINICAL DATA:  Post thoracentesis EXAM: CHEST 1 VIEW COMPARISON:  05/30/2015 FINDINGS: Decreasing right effusion post thoracentesis. No pneumothorax. Small to moderate right pleural effusion persists with right base atelectasis. Left lung is clear. IMPRESSION: Decreasing right effusion following thoracentesis without  pneumothorax. Residual small to moderate right effusion with right base atelectasis. Electronically Signed   By: Charlett Nose M.D.   On: 05/31/2015 09:36   Dg Chest 2 View  06/01/2015  CLINICAL DATA:  Shortness of breath, cough EXAM: CHEST  2 VIEW COMPARISON:  05/31/2015 FINDINGS: Moderate right pleural effusion with right lower lobe atelectasis. Left lung is clear. Heart is normal size. No acute bony abnormality. IMPRESSION: Moderate right pleural effusion with right base atelectasis, worsening since prior study. Electronically Signed   By: Charlett Nose M.D.   On: 06/01/2015 09:05   Dg Chest 2 View  05/30/2015  CLINICAL DATA:  Pt soes not understand english, PA had a interpater on the phone to talk with pt. Cough, shortness of breath. EXAM: CHEST - 2 VIEW COMPARISON:  05/18/2015 FINDINGS: Moderate right pleural effusion. Atelectasis/ consolidation of the right lung base some increase in right perihilar atelectasis since prior study. Left lung clear. Heart size normal. No pneumothorax. Visualized skeletal structures are unremarkable. IMPRESSION: 1. Increase in moderate right pleural effusion, with adjacent consolidation/atelectasis. Electronically Signed   By: Corlis Leak M.D.   On: 05/30/2015 18:22   Ct Chest W Contrast  05/31/2015  CLINICAL DATA:  Acute onset of nonproductive cough, shortness of breath, subjective fever, nausea and vomiting. Chills and right upper chest pain. Right upper back pain and right arm pain. Initial encounter. EXAM: CT CHEST WITH CONTRAST TECHNIQUE: Multidetector CT imaging of the chest was performed during intravenous contrast administration. CONTRAST:  OMNIPAQUE IOHEXOL 300 MG/ML  SOLN COMPARISON:  Chest radiograph performed 05/30/2015 FINDINGS: A moderate to large right-sided pleural effusion is noted, with partial consolidation of the right lower lobe. Haziness is noted within the expanded portions of the right lung. The left lung appears relatively clear. No  pneumothorax is seen. No definite masses are identified. There is mild leftward shift of the mediastinum. No definite mass is seen. The great vessels are grossly unremarkable in appearance. No mediastinal lymphadenopathy is seen. No pericardial effusion is identified. The visualized portions of thyroid gland are unremarkable. No axillary lymphadenopathy is seen. The visualized portions of the liver and spleen are unremarkable. The visualized portions of the gallbladder, pancreas, adrenal glands and kidneys are within normal limits. No acute osseous abnormalities are identified. IMPRESSION: 1. Moderate to large right-sided pleural effusion, with partial consolidation of the right lower lobe. The pleural effusion is of uncertain etiology. Pneumonia could have such an appearance. Underlying mass cannot be excluded. Diagnostic thoracentesis  could be considered for further evaluation, as deemed clinically appropriate. 2. Mild leftward shift of the mediastinum, reflecting the pleural effusion. Electronically Signed   By: Roanna Raider M.D.   On: 05/31/2015 05:07   US Thoracentesis Asp Pleural Space W/img Guide  05/31/2015  INDICATION: 45 year old female with a history of right-sided pleural effusion. She has been referred for image guided thoracentesis. EXAM: ULTRASOUND GUIDED RIGHT THORACENTESIS MEDICATIONS: None. COMPLICATIONS: None PROCEDURE: An ultrasound guided thoracentesis was thoroughly discussed with the patient and questions answered. The benefits, risks, alternatives and complications were also discussed. The patient understands and wishes to proceed with the procedure. Written consent was obtained. Ultrasound was performed to localize and mark an adequate pocket of fluid in the right chest. The area was then prepped and draped in the normal sterile fashion. 1% Lidocaine was used for local anesthesia. Under ultrasound guidance a Safe-T-Centesis catheter was introduced. Thoracentesis was performed. The  catheter was removed and a dressing applied. FINDINGS: A total of approximately 1.3 L of greenish thin fluid was removed. Samples were sent to the laboratory as requested by the clinical team. IMPRESSION: Status post ultrasound-guided right thoracentesis with sample sent to the lab. 1.3 L of fluid removed. Signed, Yvone Neu. Loreta Ave, DO [facility] Radiology Electronically Signed   By: Gilmer Mor D.O.   On: 05/31/2015 10:13     Scheduled Meds: . azithromycin  500 mg Intravenous Q24H  . cefTRIAXone (ROCEPHIN)  IV  1 g Intravenous Q24H  . enoxaparin (LOVENOX) injection  40 mg Subcutaneous Q24H  . ferumoxytol  510 mg Intravenous Weekly  . lisinopril  20 mg Oral Daily   Continuous Infusions: . sodium chloride 10 mL/hr (05/31/15 0820)     Pamella Pert, MD, PhD [facility] Pager [phone]  If 7PM-7AM, please contact night-coverage [email or web address] Password [facility] 06/01/2015, 3:41 PM

## 2015-06-01 NOTE — Progress Notes (Signed)
Pt received at this time 17:32pm alert with no noted distress. Pt able to follow some commands but speaks no [place]. Family at bedside. Pt denies pain or discomfort at this time.  Safety measures in place. Call bell within reach. Will continue to monitor.

## 2015-06-01 NOTE — Progress Notes (Signed)
*  PRELIMINARY RESULTS* Echocardiogram 2D Echocardiogram has been performed.  Sheena Ramirez 06/01/2015, 12:46 PM

## 2015-06-02 ENCOUNTER — Inpatient Hospital Stay (HOSPITAL_COMMUNITY): Payer: Medicaid Other

## 2015-06-02 LAB — RESPIRATORY VIRUS PANEL

## 2015-06-02 LAB — LEGIONELLA ANTIGEN, URINE

## 2015-06-02 LAB — SEDIMENTATION RATE: SED RATE: 98 mm/h — AB (ref 0–22)

## 2015-06-02 LAB — C-REACTIVE PROTEIN: CRP: 9.7 mg/dL — ABNORMAL HIGH (ref <1.0)

## 2015-06-02 MED ORDER — LIDOCAINE HCL (PF) 1 % IJ SOLN
INTRAMUSCULAR | Status: AC
  Filled 2015-06-02: qty 10

## 2015-06-02 NOTE — Progress Notes (Signed)
Pt with no complaints of pain or discomfort.  Ambulated with this nurse to the bathroom. Denies shortness of breath. 02 sat 100%. Sitting up in the bed talking on telephone. Call bell within reach. Will continue to monitor.

## 2015-06-02 NOTE — Procedures (Signed)
.  Successful US guided right thoracentesis. Yielded of hazy, amber colored fluid. Pt tolerated procedure well. No immediate complications.  Specimen was sent for labs. CXR ordered.  Brayton El PA-C 06/02/2015 12:23 PM

## 2015-06-02 NOTE — Progress Notes (Signed)
PROGRESS NOTE  Taylorann Tkach ZOX:[id number] DOB: 12-10-1970 DOA: 05/31/2015 PCP: No primary care provider on file. Outpatient Specialists:    LOS: 2 days   Brief Narrative: 45 year old female with history of hypertension, anemia, was admitted on 2/21 with shortness of breath, found to have a right pleural effusion.   Assessment & Plan: Principal Problem:   Acute respiratory failure (HCC) Active Problems:   Pleural effusion, right   Pleuritic chest pain   HTN (hypertension)   Microcytic anemia   Language barrier, cultural differences   Hyperglycemia   Community acquired pneumonia   S/P thoracentesis   Exudative pleural effusion   FUO (fever of unknown origin)   Right shoulder pain   Right-sided pleural effusion/pleuritic chest pain with possible community-acquired pneumonia - Currently under airborne precations until TB is ruled out. ID consulted - She endorses subjective fevers and night sweats as well as cough for the past 12 months. She denies any contacts with tuberculosis, however her mother-in-law had TB that was treated in the 49s. - Patient underwent thoracentesis on the right x 2: 2/21 and 2/23.  - Effusion collected 2/23 sent for: adenosine d-aminase, MTB by PCR, AFB stain and culture per ID. - Sputum AFB staining in process  Hypertension - Continue Lisinopril. Most recent BP: 140/87 mmHg.  Microcytic Anemia - Iron deficiency anemia s/p IV Feraheme 2/22. Feraheme ordered for weekly dosing. - Rx iron supplements on discharge.  Prediabetes - Hemoglobin A1c of 6.2. - Consider metformin on discharge   DVT prophylaxis: Lovenox Code Status: Full Family Communication: Husband at bedside Disposition Plan: Home when ready Barriers for discharge: TB r/o  Consultants:   ID  Procedures:   2D echo: Study Conclusions - Left ventricle: The cavity size was normal. Systolic function was normal. The estimated ejection fraction was in the range of 55%to  60%. Wall motion was normal; there were no regional wallmotion abnormalities. - Mitral valve: There was trivial regurgitation. - Tricuspid valve: There was trivial regurgitation.  Thoracentesis: 2/21; 2/23   Antimicrobials:  Azithromycin 2/21 >> 2/22  Ceftriaxone 2/21 >>  Subjective: She reports breathing well today. Dr. Arthor Captain assisted in Arabic translation at the bedside for this patient. She is apprehensive about the scheduled thoracentesis but feels well otherwise.   Objective: Filed Vitals:   06/02/15 0619 06/02/15 0759 06/02/15 1015 06/02/15 1207  BP: 133/77 128/84 138/75 140/87  Pulse: 97 85 92   Temp: 99 F (37.2 C) 98.4 F (36.9 C) 99.1 F (37.3 C)   TempSrc: Oral Oral Oral   Resp: Weight:      SpO2: 100% 100% 99%     Intake/Output Summary (Last 24 hours) at 06/02/15 1309 Last data filed at 06/01/15 1700  Gross per 24 hour  Intake      0 ml  Output    250 ml  Net   -250 ml   Filed Weights   05/31/15 1607  Weight: 111.9 kg (246 lb 11.1 oz)    Examination: BP 140/87 mmHg  Pulse 92  Temp(Src) 99.1 F (37.3 C) (Oral)  Resp 18  Wt 111.9 kg (246 lb 11.1 oz)  SpO2 99%  LMP 05/26/2015 General exam: NAD, alert. Pleasant and conversational. Respiratory system: Clear except for decreased breath sounds and dullness to percussion in RLL fields. No increased work of breathing. No wheezing, no crackles.  Cardiovascular system: regular rate and rhythm, no murmurs, gallops. No JVD. No peripheral edema.  Gastrointestinal system: Abdomen is nondistended,  soft and nontender. Normal bowel sounds heard. Central nervous system: AxOx3. No focal deficits Extremities: No clubbing/cyanosis Skin: no rashes  Data Reviewed: I have personally reviewed following labs and imaging studies  CBC:  Recent Labs Lab 05/30/15 2007 06/01/15 0717  WBC 6.6 4.6  NEUTROABS 4.1 2.7  HGB 8.4* 8.0*  HCT 28.0* 26.9*  MCV 70.4* 70.8*  PLT 369 329   Basic Metabolic  Panel:  Recent Labs Lab 05/30/15 2007 06/01/15 0717  NA 139 142  K 3.7 3.9  CL 103 103  CO2 25 26  GLUCOSE 95 108*  BUN 6 <5*  CREATININE 0.56 0.58  CALCIUM 8.9 8.8*   GFR: CrCl cannot be calculated (Unknown ideal weight.). Liver Function Tests:  Recent Labs Lab 06/01/15 0717  AST 15  ALT 16  ALKPHOS 82  BILITOT 0.2*  PROT 7.0  ALBUMIN 2.5*   No results for input(s): LIPASE, AMYLASE in the last 168 hours. No results for input(s): AMMONIA in the last 168 hours. Coagulation Profile: No results for input(s): INR, PROTIME in the last 168 hours. Cardiac Enzymes: No results for input(s): CKTOTAL, CKMB, CKMBINDEX, TROPONINI in the last 168 hours. BNP (last 3 results) No results for input(s): PROBNP in the last 8760 hours. HbA1C:  Recent Labs  05/31/15 1315  HGBA1C 6.2*   CBG: No results for input(s): GLUCAP in the last 168 hours. Lipid Profile: No results for input(s): CHOL, HDL, LDLCALC, TRIG, CHOLHDL, LDLDIRECT in the last 72 hours. Thyroid Function Tests:  Recent Labs  05/31/15 0807  TSH 1.269   Anemia Panel:  Recent Labs  05/31/15 0807  VITAMINB12 2061*  FOLATE 28.8  FERRITIN 15  TIBC 294  IRON 11*  RETICCTPCT 1.3   Urine analysis: No results found for: COLORURINE, APPEARANCEUR, LABSPEC, PHURINE, GLUCOSEU, HGBUR, BILIRUBINUR, KETONESUR, PROTEINUR, UROBILINOGEN, NITRITE, LEUKOCYTESUR Sepsis Labs: Invalid input(s): PROCALCITONIN, LACTICIDVEN  Recent Results (from the past 240 hour(s))  Culture, blood (routine x 2)     Status: None (Preliminary result)   Collection Time: 05/31/15  7:52 AM  Result Value Ref Range Status   Specimen Description BLOOD LEFT ANTECUBITAL  Final   Special Requests BOTTLES DRAWN AEROBIC AND ANAEROBIC 10CC  Final   Culture NO GROWTH 1 DAY  Final   Report Status PENDING  Incomplete  Culture, blood (routine x 2)     Status: None (Preliminary result)   Collection Time: 05/31/15  8:02 AM  Result Value Ref Range Status    Specimen Description BLOOD LEFT HAND  Final   Special Requests BOTTLES DRAWN AEROBIC ONLY 10CC  Final   Culture NO GROWTH 1 DAY  Final   Report Status PENDING  Incomplete  Culture, body fluid-bottle     Status: None (Preliminary result)   Collection Time: 05/31/15  9:29 AM  Result Value Ref Range Status   Specimen Description FLUID PLEURAL RIGHT  Final   Special Requests NONE  Final   Culture NO GROWTH 1 DAY  Final   Report Status PENDING  Incomplete  Gram stain     Status: None   Collection Time: 05/31/15  9:29 AM  Result Value Ref Range Status   Specimen Description FLUID PLEURAL RIGHT  Final   Special Requests NONE  Final   Gram Stain   Final    FEW WBC PRESENT, PREDOMINANTLY PMN NO ORGANISMS SEEN    Report Status 05/31/2015 FINAL  Final  Culture, sputum-assessment     Status: None   Collection Time: 06/01/15  6:57 AM  Result Value Ref Range Status   Specimen Description SPUTUM  Final   Special Requests NONE  Final   Sputum evaluation   Final    MICROSCOPIC FINDINGS SUGGEST THAT THIS SPECIMEN IS NOT REPRESENTATIVE OF LOWER RESPIRATORY SECRETIONS. PLEASE RECOLLECT. Gram Stain Report Called to,Read Back By and Verified With: K PRICE,RN AT 1610 06/01/15 BY L BENFIELD    Report Status 06/01/2015 FINAL  Final  Culture, expectorated sputum-assessment     Status: None   Collection Time: 06/01/15 12:01 PM  Result Value Ref Range Status   Specimen Description SPUTUM  Final   Special Requests NONE  Final   Sputum evaluation   Final    MICROSCOPIC FINDINGS SUGGEST THAT THIS SPECIMEN IS NOT REPRESENTATIVE OF LOWER RESPIRATORY SECRETIONS. PLEASE RECOLLECT. RESULT CALLED TO, READ BACK BY AND VERIFIED WITH: K PRICE 06/01/15 @ 1234 M VESTAL    Report Status 06/01/2015 FINAL  Final      Radiology Studies: Dg Chest 1 View  06/02/2015  CLINICAL DATA:  Status post thoracentesis. EXAM: CHEST 1 VIEW COMPARISON:  Chest x-ray 06/01/2015. FINDINGS: Interval decrease in size of the right-sided  pleural effusion. No postprocedural pneumothorax is identified. The left lung is clear. IMPRESSION: Interval decrease in size of the right pleural effusion falling ultrasound-guided thoracentesis. No postprocedural pneumothorax. Electronically Signed   By: Rudie Meyer M.D.   On: 06/02/2015 12:38   Dg Chest 2 View  06/01/2015  CLINICAL DATA:  Shortness of breath, cough EXAM: CHEST  2 VIEW COMPARISON:  05/31/2015 FINDINGS: Moderate right pleural effusion with right lower lobe atelectasis. Left lung is clear. Heart is normal size. No acute bony abnormality. IMPRESSION: Moderate right pleural effusion with right base atelectasis, worsening since prior study. Electronically Signed   By: Charlett Nose M.D.   On: 06/01/2015 09:05   US Thoracentesis Asp Pleural Space W/img Guide  06/02/2015  Brayton El, PA-C     06/02/2015 12:24 PM .Successful US guided right thoracentesis. Yielded of hazy, amber colored fluid. Pt tolerated procedure well. No immediate complications. Specimen was sent for labs. CXR ordered. Brayton El PA-C 06/02/2015 12:23 PM     Scheduled Meds: . cefTRIAXone (ROCEPHIN)  IV  1 g Intravenous Q24H  . enoxaparin (LOVENOX) injection  40 mg Subcutaneous Q24H  . ferumoxytol  510 mg Intravenous Weekly  . lidocaine (PF)      . lisinopril  20 mg Oral Daily   Continuous Infusions: . sodium chloride 10 mL/hr (05/31/15 0820)    Pamella Pert, MD, PhD [facility] Pager [phone]  If 7PM-7AM, please contact night-coverage [email or web address] Password [facility] 06/02/2015, 1:09 PM

## 2015-06-03 DIAGNOSIS — R739 Hyperglycemia, unspecified: Secondary | ICD-10-CM

## 2015-06-03 DIAGNOSIS — Z9889 Other specified postprocedural states: Secondary | ICD-10-CM

## 2015-06-03 DIAGNOSIS — R509 Fever, unspecified: Secondary | ICD-10-CM

## 2015-06-03 NOTE — Progress Notes (Signed)
Subjective: No new complaints   Antibiotics:  Anti-infectives    Start     Dose/Rate Route Frequency Ordered Stop   06/01/15 0600  azithromycin (ZITHROMAX) 500 mg in dextrose 5 % 250 mL IVPB  Status:  Discontinued     500 mg 250 mL/hr over 60 Minutes Intravenous Every 24 hours 05/31/15 1229 06/01/15 1543   06/01/15 0600  cefTRIAXone (ROCEPHIN) 1 g in dextrose 5 % 50 mL IVPB     1 g 100 mL/hr over 30 Minutes Intravenous Every 24 hours 05/31/15 1229 06/08/15 0559   05/31/15 1230  cefTRIAXone (ROCEPHIN) 1 g in dextrose 5 % 50 mL IVPB  Status:  Discontinued     1 g 100 mL/hr over 30 Minutes Intravenous Every 24 hours 05/31/15 1221 05/31/15 1229   05/31/15 1230  azithromycin (ZITHROMAX) 500 mg in dextrose 5 % 250 mL IVPB  Status:  Discontinued     500 mg 250 mL/hr over 60 Minutes Intravenous Every 24 hours 05/31/15 1221 05/31/15 1229   05/31/15 0530  cefTRIAXone (ROCEPHIN) 1 g in dextrose 5 % 50 mL IVPB     1 g 100 mL/hr over 30 Minutes Intravenous  Once 05/31/15 0515 05/31/15 0609   05/31/15 0530  azithromycin (ZITHROMAX) 500 mg in dextrose 5 % 250 mL IVPB     500 mg 250 mL/hr over 60 Minutes Intravenous  Once 05/31/15 0515 05/31/15 0711      Medications: Scheduled Meds: . cefTRIAXone (ROCEPHIN)  IV  1 g Intravenous Q24H  . enoxaparin (LOVENOX) injection  40 mg Subcutaneous Q24H  . ferumoxytol  510 mg Intravenous Weekly  . lisinopril  20 mg Oral Daily   Continuous Infusions: . sodium chloride 10 mL/hr (05/31/15 0820)   PRN Meds:.acetaminophen    Objective: Weight change:   Intake/Output Summary (Last 24 hours) at 06/03/15 1927 Last data filed at 06/03/15 1700  Gross per 24 hour  Intake    720 ml  Output      0 ml  Net    720 ml   Blood pressure 124/64, pulse 84, temperature 100.2 F (37.9 C), temperature source Oral, resp. rate 18, weight 246 lb 11.1 oz (111.9 kg), last menstrual period 05/26/2015, SpO2 100 %. Temp:  [98.6 F (37 C)-100.2 F (37.9 C)]  100.2 F (37.9 C) (02/24 1740) Pulse Rate:  [74-94] 84 (02/24 1740) Resp:  [18] 18 (02/24 1740) BP: (124-137)/(62-77) 124/64 mmHg (02/24 1740) SpO2:  [100 %] 100 % (02/24 1740)  Physical Exam: General: Alert and awake, oriented x3, not in any acute distress. HEENT: anicteric sclera, EOMI, oropharynx clear and without exudate Cardiovascular: tegular rate, normal r, no murmur rubs or gallops Pulmonary: Diminished breath sounds in the right side .  Gastrointestinal: soft nontender, nondistended, normal bowel sounds, Neuro: nonfocal  CBC:  CBC Latest Ref Rng 06/01/2015 05/30/2015 05/18/2015  WBC 4.0 - 10.5 K/uL 4.6 6.6 6.8  Hemoglobin 12.0 - 15.0 g/dL 8.0(L) 8.4(L) 8.6(L)  Hematocrit 36.0 - 46.0 % 26.9(L) 28.0(L) 29.7(L)  Platelets 150 - 400 K/uL 329 369 260       BMET  Recent Labs  06/01/15 0717  NA 142  K 3.9  CL 103  CO2 26  GLUCOSE 108*  BUN <5*  CREATININE 0.58  CALCIUM 8.8*     Liver Panel   Recent Labs  06/01/15 0717  PROT 7.0  ALBUMIN 2.5*  AST 15  ALT 16  ALKPHOS 82  BILITOT 0.2*  Sedimentation Rate  Recent Labs  06/02/15 0550  ESRSEDRATE 98*   C-Reactive Protein  Recent Labs  06/02/15 0550  CRP 9.7*    Micro Results: Recent Results (from the past 720 hour(s))  Culture, blood (routine x 2)     Status: None (Preliminary result)   Collection Time: 05/31/15  7:52 AM  Result Value Ref Range Status   Specimen Description BLOOD LEFT ANTECUBITAL  Final   Special Requests BOTTLES DRAWN AEROBIC AND ANAEROBIC 10CC  Final   Culture NO GROWTH 3 DAYS  Final   Report Status PENDING  Incomplete  Culture, blood (routine x 2)     Status: None (Preliminary result)   Collection Time: 05/31/15  8:02 AM  Result Value Ref Range Status   Specimen Description BLOOD LEFT HAND  Final   Special Requests BOTTLES DRAWN AEROBIC ONLY 10CC  Final   Culture NO GROWTH 3 DAYS  Final   Report Status PENDING  Incomplete  Respiratory virus panel      Status: None   Collection Time: 05/31/15  8:27 AM  Result Value Ref Range Status   Source - RVPAN NASOPHARYNGEAL  Corrected   Respiratory Syncytial Virus A Comment Negative Final    Comment: (NOTE) We are UNABLE to reliably determine a result for the specimen due to the presence of PCR inhibitor(s) in the specimen submitted.  If clinically indicated, please recollect an additional specimen for testing.    Respiratory Syncytial Virus B Comment Negative Final    Comment: (NOTE) We are UNABLE to reliably determine a result for the specimen due to the presence of PCR inhibitor(s) in the specimen submitted.  If clinically indicated, please recollect an additional specimen for testing.    Influenza A Comment Negative Final    Comment: (NOTE) We are UNABLE to reliably determine a result for the specimen due to the presence of PCR inhibitor(s) in the specimen submitted.  If clinically indicated, please recollect an additional specimen for testing.    Influenza B Comment Negative Final    Comment: (NOTE) We are UNABLE to reliably determine a result for the specimen due to the presence of PCR inhibitor(s) in the specimen submitted.  If clinically indicated, please recollect an additional specimen for testing.    Parainfluenza 1 Comment Negative Final    Comment: (NOTE) We are UNABLE to reliably determine a result for the specimen due to the presence of PCR inhibitor(s) in the specimen submitted.  If clinically indicated, please recollect an additional specimen for testing.    Parainfluenza 2 Comment Negative Final    Comment: (NOTE) We are UNABLE to reliably determine a result for the specimen due to the presence of PCR inhibitor(s) in the specimen submitted.  If clinically indicated, please recollect an additional specimen for testing.    Parainfluenza 3 Comment Negative Final    Comment: (NOTE) We are UNABLE to reliably determine a result for the specimen due to the presence  of PCR inhibitor(s) in the specimen submitted.  If clinically indicated, please recollect an additional specimen for testing.    Metapneumovirus Comment Negative Final    Comment: (NOTE) We are UNABLE to reliably determine a result for the specimen due to the presence of PCR inhibitor(s) in the specimen submitted.  If clinically indicated, please recollect an additional specimen for testing.    Rhinovirus Comment Negative Final    Comment: (NOTE) We are UNABLE to reliably determine a result for the specimen due to the presence of PCR inhibitor(s) in  the specimen submitted.  If clinically indicated, please recollect an additional specimen for testing.    Adenovirus Comment Negative Final    Comment: (NOTE) We are UNABLE to reliably determine a result for the specimen due to the presence of PCR inhibitor(s) in the specimen submitted.  If clinically indicated, please recollect an additional specimen for testing. Performed At: [facility] [address] [place], [place] [id number] Mila Homer MD WU:[id number]   Culture, body fluid-bottle     Status: None (Preliminary result)   Collection Time: 05/31/15  9:29 AM  Result Value Ref Range Status   Specimen Description FLUID PLEURAL RIGHT  Final   Special Requests NONE  Final   Culture NO GROWTH 3 DAYS  Final   Report Status PENDING  Incomplete  Gram stain     Status: None   Collection Time: 05/31/15  9:29 AM  Result Value Ref Range Status   Specimen Description FLUID PLEURAL RIGHT  Final   Special Requests NONE  Final   Gram Stain   Final    FEW WBC PRESENT, PREDOMINANTLY PMN NO ORGANISMS SEEN    Report Status 05/31/2015 FINAL  Final  Culture, sputum-assessment     Status: None   Collection Time: 06/01/15  6:57 AM  Result Value Ref Range Status   Specimen Description SPUTUM  Final   Special Requests NONE  Final   Sputum evaluation   Final    MICROSCOPIC FINDINGS SUGGEST THAT THIS SPECIMEN IS NOT REPRESENTATIVE  OF LOWER RESPIRATORY SECRETIONS. PLEASE RECOLLECT. Gram Stain Report Called to,Read Back By and Verified With: K PRICE,RN AT [id number] 06/01/15 BY L BENFIELD    Report Status 06/01/2015 FINAL  Final  Culture, expectorated sputum-assessment     Status: None   Collection Time: 06/01/15 12:01 PM  Result Value Ref Range Status   Specimen Description SPUTUM  Final   Special Requests NONE  Final   Sputum evaluation   Final    MICROSCOPIC FINDINGS SUGGEST THAT THIS SPECIMEN IS NOT REPRESENTATIVE OF LOWER RESPIRATORY SECRETIONS. PLEASE RECOLLECT. RESULT CALLED TO, READ BACK BY AND VERIFIED WITH: K PRICE 06/01/15 @ 1234 M VESTAL    Report Status 06/01/2015 FINAL  Final    Studies/Results: Dg Chest 1 View  06/02/2015  CLINICAL DATA:  Status post thoracentesis. EXAM: CHEST 1 VIEW COMPARISON:  Chest x-ray 06/01/2015. FINDINGS: Interval decrease in size of the right-sided pleural effusion. No postprocedural pneumothorax is identified. The left lung is clear. IMPRESSION: Interval decrease in size of the right pleural effusion falling ultrasound-guided thoracentesis. No postprocedural pneumothorax. Electronically Signed   By: Rudie Meyer M.D.   On: 06/02/2015 12:38   US Thoracentesis Asp Pleural Space W/img Guide  06/02/2015  Brayton El, PA-C     06/02/2015 12:24 PM .Successful US guided right thoracentesis. Yielded of hazy, amber colored fluid. Pt tolerated procedure well. No immediate complications. Specimen was sent for labs. CXR ordered. Brayton El PA-C 06/02/2015 12:23 PM      Assessment/Plan:  INTERVAL HISTORY:  Pt sp thoracocentesis   Principal Problem:   Acute respiratory failure (HCC) Active Problems:   Pleural effusion, right   Pleuritic chest pain   HTN (hypertension)   Microcytic anemia   Language barrier, cultural differences   Hyperglycemia   Community acquired pneumonia   S/P thoracentesis   Exudative pleural effusion   FUO (fever of unknown origin)   Right shoulder  pain    Sheena Ramirez is a 45 y.o. female  of  [place] origin with year long hx of night sweats, prior shoulder pain treated we believe with injectable steroids, then several week to possible 2 month hx of abdominal pain, with aggressive dyspnea on exertion and fevers now found to have a very large right-sided pleural effusion with right lower lobe consolidation. Pleural fluid characteristics are consistent with an exudative pleural effusion though not with an empyema. There is concern rightfully so for possible tuberculosis effusion.  #1 Exudative pleural effusion with concerns for possible tuberculosis effusion She is sp repeat thoracocentesis.   AFB smear and culture from pleural fluid pending ADA pending MTB PCR fluid is pending  Fungal cultures pending  AFB sputa stain and culure pending (These are being done at [facility])   --Maintain negative airborne precautions even if AFB sputum are negative until we have come to a decision about whether she has tuberculosis that is in need of treatment or not.  I contacted Dr. Gordy Councilman and patient had a borderline "t-spot"  Dr. Gordy Councilman agreed that TB should be HIGH On differential in particular I feel given chronicity of her shoulder pain which was likely referred pain this entire time rather than actual pain, along with her night sweats   I would likely start treatment if we have no diagnosis though it would be nice to have a diagnosis based on smears TB effusions are not easy to diagnose based on cultures  Fine to continue rocephin for now  #2 Shoulder pain: likely referred pain from her pleural effusion which may have gone back many months therefore.  I will get a shoulder XR for starters.   We need to coordinate her care with [facility] and this is NOT going to be DO-ABLE over the weekend, ie [facility] are not going to be able to deliver her meds over the weekend.  I spent greater than 35  minutes with the patient including greater  than 50% of time in face to face counsel of the patient and her husband using Arabic translator re her possible TB effusion, possible pulmonary TB and in coordination of their care.   Dr. Orvan Falconer is available for questions over the weekend and I will be back on Monday   LOS: 3 days   Acey Lav 06/03/2015, 7:27 PM

## 2015-06-03 NOTE — Progress Notes (Signed)
CM had referral for patient not having a PCP and not being able to afford her medications. CM met with the patient and her husband, using the interpretor line, went over Medicaid and that a PCP is assigned to the patient and that her co pay for medications is $3. Patients husband stated that the cost of medications is ok and they can afford that. Patients husband asked for CM to follow up with Medicaid for her PCP. CM contacted medicaid and was able to obtain that the patient is assigned to the [facility]. This information along with address and phone number to the clinic was given to the husband. He voiced understanding.

## 2015-06-03 NOTE — Progress Notes (Signed)
PROGRESS NOTE  Clydie Dillen WJX:[id number] DOB: 09/19/1970 DOA: 05/31/2015 PCP: No primary care provider on file. Outpatient Specialists:    LOS: 3 days   Brief Narrative: 45 year old female with history of hypertension, anemia, was admitted on 2/21 with shortness of breath, found to have a right pleural effusion.  Assessment & Plan: Principal Problem:   Acute respiratory failure (HCC) Active Problems:   Pleural effusion, right   Pleuritic chest pain   HTN (hypertension)   Microcytic anemia   Language barrier, cultural differences   Hyperglycemia   Community acquired pneumonia   S/P thoracentesis   Exudative pleural effusion   FUO (fever of unknown origin)   Right shoulder pain   Right-sided pleural effusion/pleuritic chest pain with possible community-acquired pneumonia - Currently under airborne precations until TB is ruled out. ID involved in her care. - She endorses subjective fevers and night sweats as well as cough for the past 12 months. She denies any contacts with tuberculosis, however her mother-in-law had TB that was treated in the 105s. - Patient underwent thoracentesis on the right x 2: 2/21 and 2/23.  - Effusion collected 2/23 sent for: adenosine d-aminase, MTB by PCR, AFB stain and culture per ID. - Sputum AFB staining in process. Two samples collected - need one more. - ID considering treatment of TB empirically, discussed with Dr. Algis Liming, he will contact health Department  Hypertension - Continue Lisinopril. Most recent BP: 132/75 mmHg.  Microcytic Anemia - Iron deficiency anemia s/p IV Feraheme 2/22. Feraheme ordered for weekly dosing. - Rx iron supplements on discharge.  Prediabetes - Hemoglobin A1c of 6.2. - Consider metformin on discharge   DVT prophylaxis: Lovenox Code Status: Full Family Communication: Husband at bedside Disposition Plan: Home when ready Barriers for discharge: TB r/o  Consultants:   ID  Procedures:    Thoracenteses: 2/21; 2/23  2D echo: Study Conclusions - Left ventricle: The cavity size was normal. Systolic function was normal. The estimated ejection fraction was in the range of 55%to 60%. Wall motion was normal; there were no regional wallmotion abnormalities. - Mitral valve: There was trivial regurgitation. - Tricuspid valve: There was trivial regurgitation.  Antimicrobials:  Azithromycin 2/21 >> 2/22  Ceftriaxone 2/21 >>   Subjective: Pt is sitting up in bed, alert, friendly and conversational through translator at the bedside. No pain or discomfort. No SOB, increased WOB or DOE. She is concerned about her two children (41, 77 years old) and gets emotional when told she will likely have to stay inpatient for the weekend.    Objective: Filed Vitals:   06/02/15 2243 06/03/15 0203 06/03/15 0549 06/03/15 1343  BP: 137/64 130/77 137/62 132/75  Pulse: 94 93 74 88  Temp: 98.8 F (37.1 C) 99.5 F (37.5 C) 98.8 F (37.1 C) 98.6 F (37 C)  TempSrc: Oral Oral Oral Oral  Resp: Weight:      SpO2: 100% 100% 100% 100%    Intake/Output Summary (Last 24 hours) at 06/03/15 1428 Last data filed at 06/03/15 1300  Gross per 24 hour  Intake    480 ml  Output      0 ml  Net    480 ml   Filed Weights   05/31/15 1607  Weight: 111.9 kg (246 lb 11.1 oz)    Examination: BP 132/75 mmHg  Pulse 88  Temp(Src) 98.6 F (37 C) (Oral)  Resp 18  Wt 111.9 kg (246 lb 11.1 oz)  SpO2 100%  LMP  05/26/2015 General exam: NAD Respiratory system: Clear except for decreased breath sounds in the RLL field - much improved from yesterday. No increased work of breathing. No wheezing, no crackles Cardiovascular system: regular rate and rhythm, no murmurs, gallops. No JVD. No peripheral edema.  Gastrointestinal system: Abdomen is nondistended, soft and nontender. Normal bowel sounds heard. Central nervous system: AxOx3. No focal deficits Extremities: No clubbing/cyanosis Skin: no  rashes   Data Reviewed: I have personally reviewed following labs and imaging studies  CBC:  Recent Labs Lab 05/30/15 2007 06/01/15 0717  WBC 6.6 4.6  NEUTROABS 4.1 2.7  HGB 8.4* 8.0*  HCT 28.0* 26.9*  MCV 70.4* 70.8*  PLT 369 329   Basic Metabolic Panel:  Recent Labs Lab 05/30/15 2007 06/01/15 0717  NA 139 142  K 3.7 3.9  CL 103 103  CO2 25 26  GLUCOSE 95 108*  BUN 6 <5*  CREATININE 0.56 0.58  CALCIUM 8.9 8.8*   GFR: CrCl cannot be calculated (Unknown ideal weight.). Liver Function Tests:  Recent Labs Lab 06/01/15 0717  AST 15  ALT 16  ALKPHOS 82  BILITOT 0.2*  PROT 7.0  ALBUMIN 2.5*   No results for input(s): LIPASE, AMYLASE in the last 168 hours. No results for input(s): AMMONIA in the last 168 hours. Coagulation Profile: No results for input(s): INR, PROTIME in the last 168 hours. Cardiac Enzymes: No results for input(s): CKTOTAL, CKMB, CKMBINDEX, TROPONINI in the last 168 hours. BNP (last 3 results) No results for input(s): PROBNP in the last 8760 hours. HbA1C: No results for input(s): HGBA1C in the last 72 hours. CBG: No results for input(s): GLUCAP in the last 168 hours. Lipid Profile: No results for input(s): CHOL, HDL, LDLCALC, TRIG, CHOLHDL, LDLDIRECT in the last 72 hours. Thyroid Function Tests: No results for input(s): TSH, T4TOTAL, FREET4, T3FREE, THYROIDAB in the last 72 hours. Anemia Panel: No results for input(s): VITAMINB12, FOLATE, FERRITIN, TIBC, IRON, RETICCTPCT in the last 72 hours. Urine analysis: No results found for: COLORURINE, APPEARANCEUR, LABSPEC, PHURINE, GLUCOSEU, HGBUR, BILIRUBINUR, KETONESUR, PROTEINUR, UROBILINOGEN, NITRITE, LEUKOCYTESUR Sepsis Labs: Invalid input(s): PROCALCITONIN, LACTICIDVEN  Recent Results (from the past 240 hour(s))  Culture, blood (routine x 2)     Status: None (Preliminary result)   Collection Time: 05/31/15  7:52 AM  Result Value Ref Range Status   Specimen Description BLOOD LEFT  ANTECUBITAL  Final   Special Requests BOTTLES DRAWN AEROBIC AND ANAEROBIC 10CC  Final   Culture NO GROWTH 2 DAYS  Final   Report Status PENDING  Incomplete  Culture, blood (routine x 2)     Status: None (Preliminary result)   Collection Time: 05/31/15  8:02 AM  Result Value Ref Range Status   Specimen Description BLOOD LEFT HAND  Final   Special Requests BOTTLES DRAWN AEROBIC ONLY 10CC  Final   Culture NO GROWTH 2 DAYS  Final   Report Status PENDING  Incomplete  Respiratory virus panel     Status: None   Collection Time: 05/31/15  8:27 AM  Result Value Ref Range Status   Source - RVPAN NASOPHARYNGEAL  Corrected   Respiratory Syncytial Virus A Comment Negative Final    Comment: (NOTE) We are UNABLE to reliably determine a result for the specimen due to the presence of PCR inhibitor(s) in the specimen submitted.  If clinically indicated, please recollect an additional specimen for testing.    Respiratory Syncytial Virus B Comment Negative Final    Comment: (NOTE) We are UNABLE to reliably determine  a result for the specimen due to the presence of PCR inhibitor(s) in the specimen submitted.  If clinically indicated, please recollect an additional specimen for testing.    Influenza A Comment Negative Final    Comment: (NOTE) We are UNABLE to reliably determine a result for the specimen due to the presence of PCR inhibitor(s) in the specimen submitted.  If clinically indicated, please recollect an additional specimen for testing.    Influenza B Comment Negative Final    Comment: (NOTE) We are UNABLE to reliably determine a result for the specimen due to the presence of PCR inhibitor(s) in the specimen submitted.  If clinically indicated, please recollect an additional specimen for testing.    Parainfluenza 1 Comment Negative Final    Comment: (NOTE) We are UNABLE to reliably determine a result for the specimen due to the presence of PCR inhibitor(s) in the specimen submitted.   If clinically indicated, please recollect an additional specimen for testing.    Parainfluenza 2 Comment Negative Final    Comment: (NOTE) We are UNABLE to reliably determine a result for the specimen due to the presence of PCR inhibitor(s) in the specimen submitted.  If clinically indicated, please recollect an additional specimen for testing.    Parainfluenza 3 Comment Negative Final    Comment: (NOTE) We are UNABLE to reliably determine a result for the specimen due to the presence of PCR inhibitor(s) in the specimen submitted.  If clinically indicated, please recollect an additional specimen for testing.    Metapneumovirus Comment Negative Final    Comment: (NOTE) We are UNABLE to reliably determine a result for the specimen due to the presence of PCR inhibitor(s) in the specimen submitted.  If clinically indicated, please recollect an additional specimen for testing.    Rhinovirus Comment Negative Final    Comment: (NOTE) We are UNABLE to reliably determine a result for the specimen due to the presence of PCR inhibitor(s) in the specimen submitted.  If clinically indicated, please recollect an additional specimen for testing.    Adenovirus Comment Negative Final    Comment: (NOTE) We are UNABLE to reliably determine a result for the specimen due to the presence of PCR inhibitor(s) in the specimen submitted.  If clinically indicated, please recollect an additional specimen for testing. Performed At: [facility] [address] [place], [place] [id number] Mila Homer MD [id number]   Culture, body fluid-bottle     Status: None (Preliminary result)   Collection Time: 05/31/15  9:29 AM  Result Value Ref Range Status   Specimen Description FLUID PLEURAL RIGHT  Final   Special Requests NONE  Final   Culture NO GROWTH 2 DAYS  Final   Report Status PENDING  Incomplete  Gram stain     Status: None   Collection Time: 05/31/15  9:29 AM  Result Value Ref  Range Status   Specimen Description FLUID PLEURAL RIGHT  Final   Special Requests NONE  Final   Gram Stain   Final    FEW WBC PRESENT, PREDOMINANTLY PMN NO ORGANISMS SEEN    Report Status 05/31/2015 FINAL  Final  Culture, sputum-assessment     Status: None   Collection Time: 06/01/15  6:57 AM  Result Value Ref Range Status   Specimen Description SPUTUM  Final   Special Requests NONE  Final   Sputum evaluation   Final    MICROSCOPIC FINDINGS SUGGEST THAT THIS SPECIMEN IS NOT REPRESENTATIVE OF LOWER RESPIRATORY SECRETIONS. PLEASE RECOLLECT. Gram Stain Report  Called to,Read Back By and Verified With: K PRICE,RN AT [id number] 06/01/15 BY L BENFIELD    Report Status 06/01/2015 FINAL  Final  Culture, expectorated sputum-assessment     Status: None   Collection Time: 06/01/15 12:01 PM  Result Value Ref Range Status   Specimen Description SPUTUM  Final   Special Requests NONE  Final   Sputum evaluation   Final    MICROSCOPIC FINDINGS SUGGEST THAT THIS SPECIMEN IS NOT REPRESENTATIVE OF LOWER RESPIRATORY SECRETIONS. PLEASE RECOLLECT. RESULT CALLED TO, READ BACK BY AND VERIFIED WITH: K PRICE 06/01/15 @ 1234 M VESTAL    Report Status 06/01/2015 FINAL  Final      Radiology Studies: Dg Chest 1 View  06/02/2015  CLINICAL DATA:  Status post thoracentesis. EXAM: CHEST 1 VIEW COMPARISON:  Chest x-ray 06/01/2015. FINDINGS: Interval decrease in size of the right-sided pleural effusion. No postprocedural pneumothorax is identified. The left lung is clear. IMPRESSION: Interval decrease in size of the right pleural effusion falling ultrasound-guided thoracentesis. No postprocedural pneumothorax. Electronically Signed   By: Rudie Meyer M.D.   On: 06/02/2015 12:38   US Thoracentesis Asp Pleural Space W/img Guide  06/02/2015  Brayton El, PA-C     06/02/2015 12:24 PM .Successful US guided right thoracentesis. Yielded of hazy, amber colored fluid. Pt tolerated procedure well. No immediate complications.  Specimen was sent for labs. CXR ordered. Brayton El PA-C 06/02/2015 12:23 PM     Scheduled Meds: . cefTRIAXone (ROCEPHIN)  IV  1 g Intravenous Q24H  . enoxaparin (LOVENOX) injection  40 mg Subcutaneous Q24H  . ferumoxytol  510 mg Intravenous Weekly  . lisinopril  20 mg Oral Daily   Continuous Infusions: . sodium chloride 10 mL/hr (05/31/15 0820)    Pamella Pert, MD, PhD [facility] Pager [phone]  If 7PM-7AM, please contact night-coverage [email or web address] Password [email or web address] 06/03/2015, 2:28 PM

## 2015-06-04 ENCOUNTER — Inpatient Hospital Stay (HOSPITAL_COMMUNITY): Payer: Medicaid Other

## 2015-06-04 DIAGNOSIS — J9601 Acute respiratory failure with hypoxia: Secondary | ICD-10-CM

## 2015-06-04 DIAGNOSIS — J9602 Acute respiratory failure with hypercapnia: Secondary | ICD-10-CM

## 2015-06-04 LAB — ACID FAST SMEAR (AFB, MYCOBACTERIA): Acid Fast Smear: NEGATIVE

## 2015-06-04 NOTE — Progress Notes (Signed)
Hourly rounding performed. Call light within reach. Pt in no acute distress. Denies needs.  Husband continues to keep mask off, rn reminded husband again.

## 2015-06-04 NOTE — Progress Notes (Signed)
rn reminded husband to wear mask while he is in pts room. Glass was broken in room, big pieces gotten up. Secretary called for housekeeping to bring a broom. No broom found on the unit.

## 2015-06-04 NOTE — Progress Notes (Signed)
Pt has many visitors in room, rn explained that children under 43 years old were not allowed to visit at this time. Father verbalized understanding.

## 2015-06-04 NOTE — Progress Notes (Signed)
Husband reminded again to wear mask.

## 2015-06-04 NOTE — Progress Notes (Signed)
Pt husband had mask off again when rn went into room, rn reminded husband he had to wear the mask the whole time he was visiting.

## 2015-06-04 NOTE — Progress Notes (Signed)
PROGRESS NOTE  Sheena Ramirez ZOX:[id number] DOB: [birth year] DOA: 05/31/2015 PCP: No primary care provider on file. Outpatient Specialists:    LOS: 4 days   Brief Narrative: 45 year old female with history of hypertension, anemia, was admitted on 2/21 with shortness of breath, found to have a right pleural effusion.  Assessment & Plan: Principal Problem:   Acute respiratory failure (HCC) Active Problems:   Pleural effusion, right   Pleuritic chest pain   HTN (hypertension)   Microcytic anemia   Language barrier, cultural differences   Hyperglycemia   Community acquired pneumonia   S/P thoracentesis   Exudative pleural effusion   FUO (fever of unknown origin)   Right shoulder pain   Right-sided pleural effusion/pleuritic chest pain with possible community-acquired pneumonia - Currently under airborne precations until TB is ruled out. ID involved in her care. - She endorses subjective fevers and night sweats as well as cough for the past 12 months. She denies any contacts with tuberculosis, however her mother-in-law had TB that was treated in the 7s. - Patient underwent thoracentesis on the right x 2: 2/21 and 2/23.  - Effusion collected 2/23 sent for: adenosine d-aminase, MTB by PCR, AFB stain and culture per ID. - Sputum AFB staining in process.   Hypertension - Continue Lisinopril.  Microcytic Anemia - Iron deficiency anemia s/p IV Feraheme 2/22. Feraheme ordered for weekly dosing. - Rx iron supplements on discharge.  Prediabetes - Hemoglobin A1c of 6.2. - Consider metformin on discharge   DVT prophylaxis: Lovenox Code Status: Full Family Communication: Husband at bedside Disposition Plan: Home when ready Barriers for discharge: TB r/o  Consultants:   ID  Procedures:   Thoracenteses: 2/21; 2/23  2D echo: Study Conclusions - Left ventricle: The cavity size was normal. Systolic function was normal. The estimated ejection fraction was in the  range of 55%to 60%. Wall motion was normal; there were no regional wallmotion abnormalities. - Mitral valve: There was trivial regurgitation. - Tricuspid valve: There was trivial regurgitation.  Antimicrobials:  Azithromycin 2/21 >> 2/22  Ceftriaxone 2/21 >>   Subjective: Phone interpreter used. No complaints today, asking about ambulating in the hallway   Objective: Filed Vitals:   06/03/15 2147 06/04/15 0151 06/04/15 0647 06/04/15 1046  BP: 139/83 122/64 139/86 121/60  Pulse: 89 87 80 73  Temp: 98.4 F (36.9 C) 98.2 F (36.8 C) 98.4 F (36.9 C) 100 F (37.8 C)  TempSrc: Oral Oral Oral Oral  Resp: Weight:      SpO2: 98% 100% 94% 100%    Intake/Output Summary (Last 24 hours) at 06/04/15 1419 Last data filed at 06/03/15 1700  Gross per 24 hour  Intake    240 ml  Output      0 ml  Net    240 ml   Filed Weights   05/31/15 1607  Weight: 111.9 kg (246 lb 11.1 oz)    Examination: BP 121/60 mmHg  Pulse 73  Temp(Src) 100 F (37.8 C) (Oral)  Resp 18  Wt 111.9 kg (246 lb 11.1 oz)  SpO2 100%  LMP 05/26/2015 General exam: NAD Respiratory system: Clear except for decreased breath sounds in the RLL field - much improved from yesterday. No increased work of breathing. No wheezing, no crackles Cardiovascular system: regular rate and rhythm, no murmurs, gallops. No JVD. No peripheral edema.  Gastrointestinal system: Abdomen is nondistended, soft and nontender. Normal bowel sounds heard. Central nervous system: AxOx3. No focal deficits Extremities: No clubbing/cyanosis  Skin: no rashes   Data Reviewed: I have personally reviewed following labs and imaging studies  CBC:  Recent Labs Lab 05/30/15 2007 06/01/15 0717  WBC 6.6 4.6  NEUTROABS 4.1 2.7  HGB 8.4* 8.0*  HCT 28.0* 26.9*  MCV 70.4* 70.8*  PLT 369 329   Basic Metabolic Panel:  Recent Labs Lab 05/30/15 2007 06/01/15 0717  NA 139 142  K 3.7 3.9  CL 103 103  CO2 25 26  GLUCOSE 95 108*   BUN 6 <5*  CREATININE 0.56 0.58  CALCIUM 8.9 8.8*   GFR: CrCl cannot be calculated (Unknown ideal weight.). Liver Function Tests:  Recent Labs Lab 06/01/15 0717  AST 15  ALT 16  ALKPHOS 82  BILITOT 0.2*  PROT 7.0  ALBUMIN 2.5*   No results for input(s): LIPASE, AMYLASE in the last 168 hours. No results for input(s): AMMONIA in the last 168 hours. Coagulation Profile: No results for input(s): INR, PROTIME in the last 168 hours. Cardiac Enzymes: No results for input(s): CKTOTAL, CKMB, CKMBINDEX, TROPONINI in the last 168 hours. BNP (last 3 results) No results for input(s): PROBNP in the last 8760 hours. HbA1C: No results for input(s): HGBA1C in the last 72 hours. CBG: No results for input(s): GLUCAP in the last 168 hours. Lipid Profile: No results for input(s): CHOL, HDL, LDLCALC, TRIG, CHOLHDL, LDLDIRECT in the last 72 hours. Thyroid Function Tests: No results for input(s): TSH, T4TOTAL, FREET4, T3FREE, THYROIDAB in the last 72 hours. Anemia Panel: No results for input(s): VITAMINB12, FOLATE, FERRITIN, TIBC, IRON, RETICCTPCT in the last 72 hours. Urine analysis: No results found for: COLORURINE, APPEARANCEUR, LABSPEC, PHURINE, GLUCOSEU, HGBUR, BILIRUBINUR, KETONESUR, PROTEINUR, UROBILINOGEN, NITRITE, LEUKOCYTESUR Sepsis Labs: Invalid input(s): PROCALCITONIN, LACTICIDVEN  Recent Results (from the past 240 hour(s))  Culture, blood (routine x 2)     Status: None (Preliminary result)   Collection Time: 05/31/15  7:52 AM  Result Value Ref Range Status   Specimen Description BLOOD LEFT ANTECUBITAL  Final   Special Requests BOTTLES DRAWN AEROBIC AND ANAEROBIC 10CC  Final   Culture NO GROWTH 4 DAYS  Final   Report Status PENDING  Incomplete  Culture, blood (routine x 2)     Status: None (Preliminary result)   Collection Time: 05/31/15  8:02 AM  Result Value Ref Range Status   Specimen Description BLOOD LEFT HAND  Final   Special Requests BOTTLES DRAWN AEROBIC ONLY 10CC   Final   Culture NO GROWTH 4 DAYS  Final   Report Status PENDING  Incomplete  Respiratory virus panel     Status: None   Collection Time: 05/31/15  8:27 AM  Result Value Ref Range Status   Source - RVPAN NASOPHARYNGEAL  Corrected   Respiratory Syncytial Virus A Comment Negative Final    Comment: (NOTE) We are UNABLE to reliably determine a result for the specimen due to the presence of PCR inhibitor(s) in the specimen submitted.  If clinically indicated, please recollect an additional specimen for testing.    Respiratory Syncytial Virus B Comment Negative Final    Comment: (NOTE) We are UNABLE to reliably determine a result for the specimen due to the presence of PCR inhibitor(s) in the specimen submitted.  If clinically indicated, please recollect an additional specimen for testing.    Influenza A Comment Negative Final    Comment: (NOTE) We are UNABLE to reliably determine a result for the specimen due to the presence of PCR inhibitor(s) in the specimen submitted.  If clinically indicated, please  recollect an additional specimen for testing.    Influenza B Comment Negative Final    Comment: (NOTE) We are UNABLE to reliably determine a result for the specimen due to the presence of PCR inhibitor(s) in the specimen submitted.  If clinically indicated, please recollect an additional specimen for testing.    Parainfluenza 1 Comment Negative Final    Comment: (NOTE) We are UNABLE to reliably determine a result for the specimen due to the presence of PCR inhibitor(s) in the specimen submitted.  If clinically indicated, please recollect an additional specimen for testing.    Parainfluenza 2 Comment Negative Final    Comment: (NOTE) We are UNABLE to reliably determine a result for the specimen due to the presence of PCR inhibitor(s) in the specimen submitted.  If clinically indicated, please recollect an additional specimen for testing.    Parainfluenza 3 Comment Negative  Final    Comment: (NOTE) We are UNABLE to reliably determine a result for the specimen due to the presence of PCR inhibitor(s) in the specimen submitted.  If clinically indicated, please recollect an additional specimen for testing.    Metapneumovirus Comment Negative Final    Comment: (NOTE) We are UNABLE to reliably determine a result for the specimen due to the presence of PCR inhibitor(s) in the specimen submitted.  If clinically indicated, please recollect an additional specimen for testing.    Rhinovirus Comment Negative Final    Comment: (NOTE) We are UNABLE to reliably determine a result for the specimen due to the presence of PCR inhibitor(s) in the specimen submitted.  If clinically indicated, please recollect an additional specimen for testing.    Adenovirus Comment Negative Final    Comment: (NOTE) We are UNABLE to reliably determine a result for the specimen due to the presence of PCR inhibitor(s) in the specimen submitted.  If clinically indicated, please recollect an additional specimen for testing. Performed At: [facility] [address] [place], [place] [id number] Mila Homer MD WU:[id number]   Culture, body fluid-bottle     Status: None (Preliminary result)   Collection Time: 05/31/15  9:29 AM  Result Value Ref Range Status   Specimen Description FLUID PLEURAL RIGHT  Final   Special Requests NONE  Final   Culture NO GROWTH 4 DAYS  Final   Report Status PENDING  Incomplete  Gram stain     Status: None   Collection Time: 05/31/15  9:29 AM  Result Value Ref Range Status   Specimen Description FLUID PLEURAL RIGHT  Final   Special Requests NONE  Final   Gram Stain   Final    FEW WBC PRESENT, PREDOMINANTLY PMN NO ORGANISMS SEEN    Report Status 05/31/2015 FINAL  Final  Culture, sputum-assessment     Status: None   Collection Time: 06/01/15  6:57 AM  Result Value Ref Range Status   Specimen Description SPUTUM  Final   Special Requests NONE   Final   Sputum evaluation   Final    MICROSCOPIC FINDINGS SUGGEST THAT THIS SPECIMEN IS NOT REPRESENTATIVE OF LOWER RESPIRATORY SECRETIONS. PLEASE RECOLLECT. Gram Stain Report Called to,Read Back By and Verified With: K PRICE,RN AT 0833 06/01/15 BY L BENFIELD    Report Status 06/01/2015 FINAL  Final  Culture, expectorated sputum-assessment     Status: None   Collection Time: 06/01/15 12:01 PM  Result Value Ref Range Status   Specimen Description SPUTUM  Final   Special Requests NONE  Final   Sputum evaluation  Final    MICROSCOPIC FINDINGS SUGGEST THAT THIS SPECIMEN IS NOT REPRESENTATIVE OF LOWER RESPIRATORY SECRETIONS. PLEASE RECOLLECT. RESULT CALLED TO, READ BACK BY AND VERIFIED WITH: K PRICE 06/01/15 @ 1234 M VESTAL    Report Status 06/01/2015 FINAL  Final  Acid Fast Smear (AFB)     Status: None   Collection Time: 06/03/15  1:04 AM  Result Value Ref Range Status   AFB Specimen Processing Concentration  Final   Acid Fast Smear Negative  Final    Comment: (NOTE) Performed At: [facility] [address] [place], [place] [id number] Mila Homer MD JY:[id number]    Source (AFB) EXPECTORATED SPUTUM  Final      Radiology Studies: Dg Shoulder Right Port  07-04-15  CLINICAL DATA:  Right shoulder. History right effusion and recent thoracentesis EXAM: PORTABLE RIGHT SHOULDER - 2+ VIEW COMPARISON:  06/02/2015 FINDINGS: Degenerative changes in the right [facility] joint with joint space narrowing and spurring. No acute bony abnormality. Specifically, no fracture, subluxation, or dislocation. Soft tissues are intact. Small right pleural effusion again noted, similar to prior study. IMPRESSION: No acute bony abnormality. Mild degenerative changes in the right AC joint. Small right effusion. Electronically Signed   By: Charlett Nose M.D.   On: 07-04-2015 09:22     Scheduled Meds: . cefTRIAXone (ROCEPHIN)  IV  1 g Intravenous Q24H  . enoxaparin (LOVENOX) injection  40 mg Subcutaneous  Q24H  . ferumoxytol  510 mg Intravenous Weekly  . lisinopril  20 mg Oral Daily   Continuous Infusions: . sodium chloride 10 mL/hr (05/31/15 0820)    Pamella Pert, MD, PhD [facility] Pager [phone]  If 7PM-7AM, please contact night-coverage [email or web address] Password [facility] 07/04/2015, 2:19 PM

## 2015-06-05 LAB — CULTURE, BLOOD (ROUTINE X 2)
CULTURE: NO GROWTH
Culture: NO GROWTH

## 2015-06-05 LAB — ADENOSIDE DEAMINASE, PLEURAL FL: ADENOSIDE DEAMINASE, PLEURAL FL: 9.4 U/L (ref 0.0–9.4)

## 2015-06-05 LAB — CULTURE, BODY FLUID-BOTTLE: CULTURE: NO GROWTH

## 2015-06-05 LAB — CULTURE, BODY FLUID W GRAM STAIN -BOTTLE

## 2015-06-05 NOTE — Progress Notes (Signed)
PROGRESS NOTE  Sheena Ramirez ZOX:[id number] DOB: 1970-12-15 DOA: 05/31/2015 PCP: No primary care provider on file. Outpatient Specialists:    LOS: 5 days   Brief Narrative: 45 year old female with history of hypertension, anemia, was admitted on 2/21 with shortness of breath, found to have a right pleural effusion.  Assessment & Plan: Principal Problem:   Acute respiratory failure (HCC) Active Problems:   Pleural effusion, right   Pleuritic chest pain   HTN (hypertension)   Microcytic anemia   Language barrier, cultural differences   Hyperglycemia   Community acquired pneumonia   S/P thoracentesis   Exudative pleural effusion   FUO (fever of unknown origin)   Right shoulder pain   Right-sided pleural effusion/pleuritic chest pain with possible community-acquired pneumonia - Currently under airborne precations until TB is ruled out. ID involved in her care. - She endorses subjective fevers and night sweats as well as cough for the past 12 months. She denies any contacts with tuberculosis, however her mother-in-law had TB that was treated in the 74s. - Patient underwent thoracentesis on the right x 2: 2/21 and 2/23.  - Effusion collected 2/23 sent for: adenosine d-aminase, MTB by PCR, AFB stain and culture per ID. - Sputum AFB staining in process x 3. First one negative.  Hypertension - Continue Lisinopril.  Microcytic Anemia - Iron deficiency anemia s/p IV Feraheme 2/22. Feraheme ordered for weekly dosing. - Rx iron supplements on discharge.  Prediabetes - Hemoglobin A1c of 6.2. - Consider metformin on discharge   DVT prophylaxis: Lovenox Code Status: Full Family Communication: Husband at bedside Disposition Plan: Home when ready Barriers for discharge: TB r/o  Consultants:   ID  Procedures:   Thoracenteses: 2/21; 2/23  2D echo: Study Conclusions - Left ventricle: The cavity size was normal. Systolic function was normal. The estimated  ejection fraction was in the range of 55%to 60%. Wall motion was normal; there were no regional wallmotion abnormalities. - Mitral valve: There was trivial regurgitation. - Tricuspid valve: There was trivial regurgitation.  Antimicrobials:  Azithromycin 2/21 >> 2/22  Ceftriaxone 2/21 >>   Subjective: Phone interpreter used, upbeat, praying for this not to be TB   Objective: Filed Vitals:   06/04/15 1829 06/04/15 2109 06/05/15 0100 06/05/15 0515  BP: 119/71 128/64 139/71 134/64  Pulse: 71 79 83 86  Temp: 97.9 F (36.6 C) 98.2 F (36.8 C) 98.4 F (36.9 C) 98.3 F (36.8 C)  TempSrc: Oral Oral Oral Oral  Resp: 18 20 20 20   Weight:      SpO2: 100% 100% 93% 95%   No intake or output data in the 24 hours ending 06/05/15 1313 Filed Weights   05/31/15 1607  Weight: 111.9 kg (246 lb 11.1 oz)    Examination: BP 134/64 mmHg  Pulse 86  Temp(Src) 98.3 F (36.8 C) (Oral)  Resp 20  Wt 111.9 kg (246 lb 11.1 oz)  SpO2 95%  LMP 05/26/2015 General exam: NAD Respiratory system: no wheezing, no increased work of breathing. No wheezing, no crackles Cardiovascular system: regular rate and rhythm, no murmurs, gallops. No JVD. No peripheral edema.  Gastrointestinal system: Abdomen is nondistended, soft and nontender. Normal bowel sounds heard. Central nervous system: AxOx3. No focal deficits Extremities: No clubbing/cyanosis Skin: no rashes   Data Reviewed: I have personally reviewed following labs and imaging studies  CBC:  Recent Labs Lab 05/30/15 2007 06/01/15 0717  WBC 6.6 4.6  NEUTROABS 4.1 2.7  HGB 8.4* 8.0*  HCT 28.0* 26.9*  MCV 70.4* 70.8*  PLT 369 329   Basic Metabolic Panel:  Recent Labs Lab 05/30/15 2007 06/01/15 0717  NA 139 142  K 3.7 3.9  CL 103 103  CO2 25 26  GLUCOSE 95 108*  BUN 6 <5*  CREATININE 0.56 0.58  CALCIUM 8.9 8.8*   GFR: CrCl cannot be calculated (Unknown ideal weight.). Liver Function Tests:  Recent Labs Lab 06/01/15 0717    AST 15  ALT 16  ALKPHOS 82  BILITOT 0.2*  PROT 7.0  ALBUMIN 2.5*     Recent Results (from the past 240 hour(s))  Culture, blood (routine x 2)     Status: None (Preliminary result)   Collection Time: 05/31/15  7:52 AM  Result Value Ref Range Status   Specimen Description BLOOD LEFT ANTECUBITAL  Final   Special Requests BOTTLES DRAWN AEROBIC AND ANAEROBIC 10CC  Final   Culture NO GROWTH 4 DAYS  Final   Report Status PENDING  Incomplete  Culture, blood (routine x 2)     Status: None (Preliminary result)   Collection Time: 05/31/15  8:02 AM  Result Value Ref Range Status   Specimen Description BLOOD LEFT HAND  Final   Special Requests BOTTLES DRAWN AEROBIC ONLY 10CC  Final   Culture NO GROWTH 4 DAYS  Final   Report Status PENDING  Incomplete  Respiratory virus panel     Status: None   Collection Time: 05/31/15  8:27 AM  Result Value Ref Range Status   Source - RVPAN NASOPHARYNGEAL  Corrected   Respiratory Syncytial Virus A Comment Negative Final    Comment: (NOTE) We are UNABLE to reliably determine a result for the specimen due to the presence of PCR inhibitor(s) in the specimen submitted.  If clinically indicated, please recollect an additional specimen for testing.    Respiratory Syncytial Virus B Comment Negative Final    Comment: (NOTE) We are UNABLE to reliably determine a result for the specimen due to the presence of PCR inhibitor(s) in the specimen submitted.  If clinically indicated, please recollect an additional specimen for testing.    Influenza A Comment Negative Final    Comment: (NOTE) We are UNABLE to reliably determine a result for the specimen due to the presence of PCR inhibitor(s) in the specimen submitted.  If clinically indicated, please recollect an additional specimen for testing.    Influenza B Comment Negative Final    Comment: (NOTE) We are UNABLE to reliably determine a result for the specimen due to the presence of PCR inhibitor(s) in  the specimen submitted.  If clinically indicated, please recollect an additional specimen for testing.    Parainfluenza 1 Comment Negative Final    Comment: (NOTE) We are UNABLE to reliably determine a result for the specimen due to the presence of PCR inhibitor(s) in the specimen submitted.  If clinically indicated, please recollect an additional specimen for testing.    Parainfluenza 2 Comment Negative Final    Comment: (NOTE) We are UNABLE to reliably determine a result for the specimen due to the presence of PCR inhibitor(s) in the specimen submitted.  If clinically indicated, please recollect an additional specimen for testing.    Parainfluenza 3 Comment Negative Final    Comment: (NOTE) We are UNABLE to reliably determine a result for the specimen due to the presence of PCR inhibitor(s) in the specimen submitted.  If clinically indicated, please recollect an additional specimen for testing.    Metapneumovirus Comment Negative Final    Comment: (  NOTE) We are UNABLE to reliably determine a result for the specimen due to the presence of PCR inhibitor(s) in the specimen submitted.  If clinically indicated, please recollect an additional specimen for testing.    Rhinovirus Comment Negative Final    Comment: (NOTE) We are UNABLE to reliably determine a result for the specimen due to the presence of PCR inhibitor(s) in the specimen submitted.  If clinically indicated, please recollect an additional specimen for testing.    Adenovirus Comment Negative Final    Comment: (NOTE) We are UNABLE to reliably determine a result for the specimen due to the presence of PCR inhibitor(s) in the specimen submitted.  If clinically indicated, please recollect an additional specimen for testing. Performed At: [facility] [address] [place], [place] [id number] Mila Homer MD WU:[id number]   Culture, body fluid-bottle     Status: None (Preliminary result)   Collection  Time: 05/31/15  9:29 AM  Result Value Ref Range Status   Specimen Description FLUID PLEURAL RIGHT  Final   Special Requests NONE  Final   Culture NO GROWTH 4 DAYS  Final   Report Status PENDING  Incomplete  Gram stain     Status: None   Collection Time: 05/31/15  9:29 AM  Result Value Ref Range Status   Specimen Description FLUID PLEURAL RIGHT  Final   Special Requests NONE  Final   Gram Stain   Final    FEW WBC PRESENT, PREDOMINANTLY PMN NO ORGANISMS SEEN    Report Status 05/31/2015 FINAL  Final  Culture, sputum-assessment     Status: None   Collection Time: 06/01/15  6:57 AM  Result Value Ref Range Status   Specimen Description SPUTUM  Final   Special Requests NONE  Final   Sputum evaluation   Final    MICROSCOPIC FINDINGS SUGGEST THAT THIS SPECIMEN IS NOT REPRESENTATIVE OF LOWER RESPIRATORY SECRETIONS. PLEASE RECOLLECT. Gram Stain Report Called to,Read Back By and Verified With: K PRICE,RN AT 9562 06/01/15 BY L BENFIELD    Report Status 06/01/2015 FINAL  Final  Culture, expectorated sputum-assessment     Status: None   Collection Time: 06/01/15 12:01 PM  Result Value Ref Range Status   Specimen Description SPUTUM  Final   Special Requests NONE  Final   Sputum evaluation   Final    MICROSCOPIC FINDINGS SUGGEST THAT THIS SPECIMEN IS NOT REPRESENTATIVE OF LOWER RESPIRATORY SECRETIONS. PLEASE RECOLLECT. RESULT CALLED TO, READ BACK BY AND VERIFIED WITH: K PRICE 06/01/15 @ 1234 M VESTAL    Report Status 06/01/2015 FINAL  Final  Acid Fast Smear (AFB)     Status: None   Collection Time: 06/03/15  1:04 AM  Result Value Ref Range Status   AFB Specimen Processing Concentration  Final   Acid Fast Smear Negative  Final    Comment: (NOTE) Performed At: [facility] [address] [place], [place] [id number] Mila Homer MD ON:[id number]    Source (AFB) EXPECTORATED SPUTUM  Final      Radiology Studies: Dg Shoulder Right Port  06/19/2015  CLINICAL DATA:  Right  shoulder. History right effusion and recent thoracentesis EXAM: PORTABLE RIGHT SHOULDER - 2+ VIEW COMPARISON:  06/02/2015 FINDINGS: Degenerative changes in the right [facility] joint with joint space narrowing and spurring. No acute bony abnormality. Specifically, no fracture, subluxation, or dislocation. Soft tissues are intact. Small right pleural effusion again noted, similar to prior study. IMPRESSION: No acute bony abnormality. Mild degenerative changes in the right AC joint.  Small right effusion. Electronically Signed   By: Charlett Nose M.D.   On: 06/04/2015 09:22     Scheduled Meds: . cefTRIAXone (ROCEPHIN)  IV  1 g Intravenous Q24H  . enoxaparin (LOVENOX) injection  40 mg Subcutaneous Q24H  . ferumoxytol  510 mg Intravenous Weekly  . lisinopril  20 mg Oral Daily   Continuous Infusions: . sodium chloride 10 mL/hr at 06/04/15 9604    Pamella Pert, MD, PhD [facility] Pager [phone]  If 7PM-7AM, please contact night-coverage [email or web address] Password [email or web address] 06/05/2015, 1:13 PM

## 2015-06-05 NOTE — Progress Notes (Signed)
Tech observed family member of patient with mask off in room. Tech re-educated the family member on the importance of keeping the airborne mask on at all times while in the room, as well as proper placement of the mask.

## 2015-06-05 NOTE — Progress Notes (Signed)
Pt visitors brought soup into pts room. Then asking if soup could be warmed up. rn checked with charge rn, and pt and visitor were informed soup could not be taken out of room to be heated up.

## 2015-06-06 ENCOUNTER — Encounter (HOSPITAL_COMMUNITY): Payer: Self-pay | Admitting: Infectious Disease

## 2015-06-06 DIAGNOSIS — A1889 Tuberculosis of other sites: Secondary | ICD-10-CM

## 2015-06-06 DIAGNOSIS — J90 Pleural effusion, not elsewhere classified: Secondary | ICD-10-CM | POA: Insufficient documentation

## 2015-06-06 HISTORY — DX: Tuberculosis of other sites: A18.89

## 2015-06-06 LAB — ACID FAST SMEAR (AFB, MYCOBACTERIA)
Acid Fast Smear: NEGATIVE
Acid Fast Smear: NEGATIVE
Acid Fast Smear: NEGATIVE

## 2015-06-06 LAB — MISC LABCORP TEST (SEND OUT): LABCORP TEST CODE: 183059

## 2015-06-06 LAB — ACID FAST SMEAR (AFB)
ACID FAST SMEAR - AFSCU2: NEGATIVE
ACID FAST SMEAR - AFSCU2: NEGATIVE

## 2015-06-06 LAB — QUANTIFERON IN TUBE
QFT TB AG MINUS NIL VALUE: 4.99 IU/mL
QUANTIFERON MITOGEN VALUE: >10 IU/mL
QUANTIFERON TB AG VALUE: 5.17 [IU]/mL
QUANTIFERON TB GOLD: POSITIVE — AB
Quantiferon Nil Value: 0.18 IU/mL

## 2015-06-06 LAB — QUANTIFERON TB GOLD ASSAY (BLOOD)

## 2015-06-06 MED ORDER — RIFAMPIN 300 MG PO CAPS: 600.0000 mg | ORAL_CAPSULE | Freq: Every day | ORAL | Status: AC

## 2015-06-06 MED ORDER — ETHAMBUTOL HCL 400 MG PO TABS
1600.0000 mg | ORAL_TABLET | Freq: Every day | ORAL | Status: DC
  Administered 2015-06-06: 1600 mg via ORAL
  Filled 2015-06-06: qty 4

## 2015-06-06 MED ORDER — PYRAZINAMIDE 500 MG PO TABS
2000.0000 mg | ORAL_TABLET | Freq: Every day | ORAL | Status: DC
  Administered 2015-06-06: 2000 mg via ORAL
  Filled 2015-06-06 (×2): qty 4

## 2015-06-06 MED ORDER — ISONIAZID 300 MG PO TABS
300.0000 mg | ORAL_TABLET | Freq: Every day | ORAL | Status: DC
  Administered 2015-06-06: 300 mg via ORAL
  Filled 2015-06-06: qty 1

## 2015-06-06 MED ORDER — TRAMADOL HCL 50 MG PO TABS: 50.0000 mg | ORAL_TABLET | Freq: Four times a day (QID) | ORAL | Status: AC | PRN

## 2015-06-06 MED ORDER — PYRIDOXINE HCL 50 MG PO TABS: 50.0000 mg | ORAL_TABLET | Freq: Every day | ORAL | Status: AC

## 2015-06-06 MED ORDER — INFLUENZA VAC SPLIT QUAD 0.5 ML IM SUSY: 0.5000 mL | PREFILLED_SYRINGE | INTRAMUSCULAR | Status: DC

## 2015-06-06 MED ORDER — RIFAMPIN 300 MG PO CAPS
600.0000 mg | ORAL_CAPSULE | Freq: Every day | ORAL | Status: DC
  Administered 2015-06-06: 600 mg via ORAL
  Filled 2015-06-06: qty 2

## 2015-06-06 MED ORDER — ISONIAZID 300 MG PO TABS: 300.0000 mg | ORAL_TABLET | Freq: Every day | ORAL | Status: AC

## 2015-06-06 MED ORDER — VITAMIN B-6 50 MG PO TABS
50.0000 mg | ORAL_TABLET | Freq: Every day | ORAL | Status: DC
  Administered 2015-06-06: 50 mg via ORAL
  Filled 2015-06-06: qty 1

## 2015-06-06 MED ORDER — PYRAZINAMIDE 500 MG PO TABS: 2000.0000 mg | ORAL_TABLET | Freq: Every day | ORAL | Status: AC

## 2015-06-06 MED ORDER — INFLUENZA VAC SPLIT QUAD 0.5 ML IM SUSY
0.5000 mL | PREFILLED_SYRINGE | Freq: Once | INTRAMUSCULAR | Status: AC
  Administered 2015-06-06: 0.5 mL via INTRAMUSCULAR
  Filled 2015-06-06: qty 0.5

## 2015-06-06 MED ORDER — FERROUS SULFATE 325 (65 FE) MG PO TBEC: 325.0000 mg | DELAYED_RELEASE_TABLET | Freq: Three times a day (TID) | ORAL | Status: DC

## 2015-06-06 MED ORDER — ETHAMBUTOL HCL 400 MG PO TABS: 1600.0000 mg | ORAL_TABLET | Freq: Every day | ORAL | Status: AC

## 2015-06-06 NOTE — Progress Notes (Signed)
Pt discharged home with family. Received one dose of each TB med. [facility] Department will visit pt at her home tomorrow and provide all other prescriptions for round of treatment. IV removed and telemetry discontinued. Discharge information given and notes from MD and case management. All questions asked and answered. Pt left unit by ambulation at 1845. Politely refused staff accompaniment. [place] [facility]

## 2015-06-06 NOTE — Discharge Summary (Addendum)
Physician Discharge Summary  Sheena Ramirez [facility] ZOX:[id number] DOB: Dec 15, 1970 DOA: 05/31/2015  PCP: No primary care provider on file.  Admit date: 05/31/2015 Discharge date: 06/06/2015  Time spent: > 50 minutes  Recommendations for Outpatient Follow-up:  1. [facility] Department of Public Health follow up in 1 day  Discharge Diagnoses:  Principal Problem:   Acute respiratory failure (HCC) Active Problems:   Pleural effusion, right   Pleuritic chest pain   HTN (hypertension)   Microcytic anemia   Language barrier, cultural differences   Hyperglycemia   Community acquired pneumonia   S/P thoracentesis   Exudative pleural effusion   FUO (fever of unknown origin)   Right shoulder pain   Extrapulmonary tuberculosis   Pleural effusion on right  Discharge Condition: stable  Diet recommendation: regular  Filed Weights   05/31/15 1607 06/01/15 1738  Weight: 111.9 kg (246 lb 11.1 oz) 111.9 kg (246 lb 11.1 oz)    History of present illness:  See H&P, Labs, Consult and Test reports for all details in brief, patient is a 45 year old female with history of hypertension, anemia, was admitted on 2/21 with shortness of breath, found to have a right pleural effusion.  Hospital Course:  Right-sided pleural effusion/pleuritic chest pain with possible community-acquired pneumonia -  Patient was admitted to the hospital initially with a diagnosis of pneumonia with right-sided pleural effusion. Given symptoms off night sweats, cough over the last 12 months, and given the fact that she recently came to the [place] from [place], tuberculosis needed to be rule out and infectious disease was consulted.  Patient underwent thoracentesis on the right twice on 2/21 and 2/23, with improvement in her pleuritic type pain as well as the respiratory status. Fluid was exudative. She underwent AFB stains for sputum 3, the first one was negative, secondary to third one are pending at the time  of discharge. AFB staining her pleural fluid was also negative. AFB Cultures are still pending at the time of discharge. She had a quantiferon testing done which was positive, suggesting past TB exposure at least. Mycobacterium PB PCR from pleural fluid was negative. Adenosine deaminase in the pleural fluid was negative as well. Given no definitive positive results, Dr. Daiva Eves and myself discussed with the patient about possibility of pleural biopsy, which may increase the yield for positive identification of tuberculosis however this would not change management at least right now as she would need to start treatment for 4 drug regimen therapy. In discussing with the patient, it was decided that patient will start treatment, and defer biopsy for now. She was discharged home in stable condition, Dr. Daiva Eves with infectious disease was in permanent discussions with the  [facility] Department of Public Health throughout the case, and they will follow-up with the patient after discharge.   Hypertension - Continue Lisinopril. Microcytic Anemia - Iron deficiency anemia s/p IV Feraheme 2/22,  Iron prescribed for the patient on discharge. Prediabetes - Hemoglobin A1c of 6.2.  Diet-controlled  Procedures:  Thoracenteses: 2/21; 2/23  2D echo: Study Conclusions - Left ventricle: The cavity size was normal. Systolic function was normal. The estimated ejection fraction was in the range of 55%to 60%. Wall motion was normal; there were no regional wallmotion abnormalities. - Mitral valve: There was trivial regurgitation. - Tricuspid valve: There was trivial regurgitation.   Consultations:  ID  Discharge Exam: Filed Vitals:   06/06/15 0241 06/06/15 1010 06/06/15 1413 06/06/15 1730  BP: 115/48 117/61 120/61 135/58  Pulse: 70 74 81 88  Temp: 98.1 F (36.7 C) 97.9 F (36.6 C) 98.8 F (37.1 C) 98.2 F (36.8 C)  TempSrc: Oral Oral Oral Oral  Resp: 16 18 18 18   Height:   5\' 8"  (1.727 m)   Weight:       SpO2: 98% 92% 100% 100%    General: NAD Cardiovascular: RRR Respiratory: CTA biL  Discharge Instructions Activity:  As tolerated   Get Medicines reviewed and adjusted: Please take all your medications with you for your next visit with your Primary MD  Please request your Primary MD to go over all hospital tests and procedure/radiological results at the follow up, please ask your Primary MD to get all Hospital records sent to his/her office.  If you experience worsening of your admission symptoms, develop shortness of breath, life threatening emergency, suicidal or homicidal thoughts you must seek medical attention immediately by calling 911 or calling your MD immediately if symptoms less severe.  You must read complete instructions/literature along with all the possible adverse reactions/side effects for all the Medicines you take and that have been prescribed to you. Take any new Medicines after you have completely understood and accpet all the possible adverse reactions/side effects.   Do not drive when taking Pain medications.   Do not take more than prescribed Pain, Sleep and Anxiety Medications  Special Instructions: If you have smoked or chewed Tobacco in the last 2 yrs please stop smoking, stop any regular Alcohol and or any Recreational drug use.  Wear Seat belts while driving.  Please note  You were cared for by a hospitalist during your hospital stay. Once you are discharged, your primary care physician will handle any further medical issues. Please note that NO REFILLS for any discharge medications will be authorized once you are discharged, as it is imperative that you return to your primary care physician (or establish a relationship with a primary care physician if you do not have one) for your aftercare needs so that they can reassess your need for medications and monitor your lab values.    Medication List    TAKE these medications        ethambutol 400 MG  tablet  Commonly known as:  MYAMBUTOL  Take 4 tablets (1,600 mg total) by mouth daily.     ferrous sulfate 325 (65 FE) MG EC tablet  Take 1 tablet (325 mg total) by mouth 3 (three) times daily with meals.     isoniazid 300 MG tablet  Commonly known as:  NYDRAZID  Take 1 tablet (300 mg total) by mouth daily.     lisinopril 10 MG tablet  Commonly known as:  PRINIVIL,ZESTRIL  Take 20 mg by mouth daily.     ondansetron 4 MG tablet  Commonly known as:  ZOFRAN  Take 1 tablet (4 mg total) by mouth every 8 (eight) hours as needed for nausea or vomiting.     pyrazinamide 500 MG tablet  Take 4 tablets (2,000 mg total) by mouth daily.     pyridOXINE 50 MG tablet  Commonly known as:  B-6  Take 1 tablet (50 mg total) by mouth daily.     rifampin 300 MG capsule  Commonly known as:  RIFADIN  Take 2 capsules (600 mg total) by mouth daily.     traMADol 50 MG tablet  Commonly known as:  ULTRAM  Take 1 tablet (50 mg total) by mouth every 6 (six) hours as needed.  Follow-up Information    Follow up with health department In 1 day.   Why:  will call you      The results of significant diagnostics from this hospitalization (including imaging, microbiology, ancillary and laboratory) are listed below for reference.    Significant Diagnostic Studies: Dg Chest 1 View  06/02/2015  CLINICAL DATA:  Status post thoracentesis. EXAM: CHEST 1 VIEW COMPARISON:  Chest x-ray 06/01/2015. FINDINGS: Interval decrease in size of the right-sided pleural effusion. No postprocedural pneumothorax is identified. The left lung is clear. IMPRESSION: Interval decrease in size of the right pleural effusion falling ultrasound-guided thoracentesis. No postprocedural pneumothorax. Electronically Signed   By: Rudie Meyer M.D.   On: 06/02/2015 12:38   Dg Chest 1 View  05/31/2015  CLINICAL DATA:  Post thoracentesis EXAM: CHEST 1 VIEW COMPARISON:  05/30/2015 FINDINGS: Decreasing right effusion post  thoracentesis. No pneumothorax. Small to moderate right pleural effusion persists with right base atelectasis. Left lung is clear. IMPRESSION: Decreasing right effusion following thoracentesis without pneumothorax. Residual small to moderate right effusion with right base atelectasis. Electronically Signed   By: Charlett Nose M.D.   On: 05/31/2015 09:36   Dg Chest 2 View  06/01/2015  CLINICAL DATA:  Shortness of breath, cough EXAM: CHEST  2 VIEW COMPARISON:  05/31/2015 FINDINGS: Moderate right pleural effusion with right lower lobe atelectasis. Left lung is clear. Heart is normal size. No acute bony abnormality. IMPRESSION: Moderate right pleural effusion with right base atelectasis, worsening since prior study. Electronically Signed   By: Charlett Nose M.D.   On: 06/01/2015 09:05   Dg Chest 2 View  05/30/2015  CLINICAL DATA:  Pt soes not understand english, PA had a interpater on the phone to talk with pt. Cough, shortness of breath. EXAM: CHEST - 2 VIEW COMPARISON:  05/18/2015 FINDINGS: Moderate right pleural effusion. Atelectasis/ consolidation of the right lung base some increase in right perihilar atelectasis since prior study. Left lung clear. Heart size normal. No pneumothorax. Visualized skeletal structures are unremarkable. IMPRESSION: 1. Increase in moderate right pleural effusion, with adjacent consolidation/atelectasis. Electronically Signed   By: Corlis Leak M.D.   On: 05/30/2015 18:22   Ct Chest W Contrast  05/31/2015  CLINICAL DATA:  Acute onset of nonproductive cough, shortness of breath, subjective fever, nausea and vomiting. Chills and right upper chest pain. Right upper back pain and right arm pain. Initial encounter. EXAM: CT CHEST WITH CONTRAST TECHNIQUE: Multidetector CT imaging of the chest was performed during intravenous contrast administration. CONTRAST:  OMNIPAQUE IOHEXOL 300 MG/ML  SOLN COMPARISON:  Chest radiograph performed 05/30/2015 FINDINGS: A moderate to large  right-sided pleural effusion is noted, with partial consolidation of the right lower lobe. Haziness is noted within the expanded portions of the right lung. The left lung appears relatively clear. No pneumothorax is seen. No definite masses are identified. There is mild leftward shift of the mediastinum. No definite mass is seen. The great vessels are grossly unremarkable in appearance. No mediastinal lymphadenopathy is seen. No pericardial effusion is identified. The visualized portions of thyroid gland are unremarkable. No axillary lymphadenopathy is seen. The visualized portions of the liver and spleen are unremarkable. The visualized portions of the gallbladder, pancreas, adrenal glands and kidneys are within normal limits. No acute osseous abnormalities are identified. IMPRESSION: 1. Moderate to large right-sided pleural effusion, with partial consolidation of the right lower lobe. The pleural effusion is of uncertain etiology. Pneumonia could have such an appearance. Underlying mass cannot be excluded. Diagnostic  thoracentesis could be considered for further evaluation, as deemed clinically appropriate. 2. Mild leftward shift of the mediastinum, reflecting the pleural effusion. Electronically Signed   By: Roanna Raider M.D.   On: 05/31/2015 05:07   Dg Shoulder Right Port  06/04/2015  CLINICAL DATA:  Right shoulder. History right effusion and recent thoracentesis EXAM: PORTABLE RIGHT SHOULDER - 2+ VIEW COMPARISON:  06/02/2015 FINDINGS: Degenerative changes in the right [facility] joint with joint space narrowing and spurring. No acute bony abnormality. Specifically, no fracture, subluxation, or dislocation. Soft tissues are intact. Small right pleural effusion again noted, similar to prior study. IMPRESSION: No acute bony abnormality. Mild degenerative changes in the right AC joint. Small right effusion. Electronically Signed   By: Charlett Nose M.D.   On: 06/04/2015 09:22   Dg Abd 2 Views  05/18/2015  CLINICAL  DATA:  Right upper quadrant pain x2 weeks, low-grade fever EXAM: ABDOMEN - 2 VIEW COMPARISON:  None. FINDINGS: Nonobstructive bowel gas pattern. Moderate right colonic stool burden. No evidence of free air under the diaphragm on the upright view. Visualized osseous structures are within normal limits. IMPRESSION: No evidence of small bowel obstruction or free air. Moderate right colonic stool burden. Electronically Signed   By: Charline Bills M.D.   On: 05/18/2015 18:48   US Thoracentesis Asp Pleural Space W/img Guide  06/02/2015  Brayton El, PA-C     06/02/2015 12:24 PM .Successful US guided right thoracentesis. Yielded of hazy, amber colored fluid. Pt tolerated procedure well. No immediate complications. Specimen was sent for labs. CXR ordered. Brayton El PA-C 06/02/2015 12:23 PM   US Thoracentesis Asp Pleural Space W/img Guide  05/31/2015  INDICATION: 45 year old female with a history of right-sided pleural effusion. She has been referred for image guided thoracentesis. EXAM: ULTRASOUND GUIDED RIGHT THORACENTESIS MEDICATIONS: None. COMPLICATIONS: None PROCEDURE: An ultrasound guided thoracentesis was thoroughly discussed with the patient and questions answered. The benefits, risks, alternatives and complications were also discussed. The patient understands and wishes to proceed with the procedure. Written consent was obtained. Ultrasound was performed to localize and mark an adequate pocket of fluid in the right chest. The area was then prepped and draped in the normal sterile fashion. 1% Lidocaine was used for local anesthesia. Under ultrasound guidance a Safe-T-Centesis catheter was introduced. Thoracentesis was performed. The catheter was removed and a dressing applied. FINDINGS: A total of approximately 1.3 L of greenish thin fluid was removed. Samples were sent to the laboratory as requested by the clinical team. IMPRESSION: Status post ultrasound-guided right thoracentesis with sample  sent to the lab. 1.3 L of fluid removed. Signed, Yvone Neu. Loreta Ave, DO [facility] Electronically Signed   By: Gilmer Mor D.O.   On: 05/31/2015 10:13    Microbiology: Recent Results (from the past 240 hour(s))  Culture, blood (routine x 2)     Status: None   Collection Time: 05/31/15  7:52 AM  Result Value Ref Range Status   Specimen Description BLOOD LEFT ANTECUBITAL  Final   Special Requests BOTTLES DRAWN AEROBIC AND ANAEROBIC 10CC  Final   Culture NO GROWTH 5 DAYS  Final   Report Status 06/05/2015 FINAL  Final  Culture, blood (routine x 2)     Status: None   Collection Time: 05/31/15  8:02 AM  Result Value Ref Range Status   Specimen Description BLOOD LEFT HAND  Final   Special Requests BOTTLES DRAWN AEROBIC ONLY 10CC  Final   Culture NO GROWTH  5 DAYS  Final   Report Status 06/05/2015 FINAL  Final  Respiratory virus panel     Status: None   Collection Time: 05/31/15  8:27 AM  Result Value Ref Range Status   Source - RVPAN NASOPHARYNGEAL  Corrected   Respiratory Syncytial Virus A Comment Negative Final    Comment: (NOTE) We are UNABLE to reliably determine a result for the specimen due to the presence of PCR inhibitor(s) in the specimen submitted.  If clinically indicated, please recollect an additional specimen for testing.    Respiratory Syncytial Virus B Comment Negative Final    Comment: (NOTE) We are UNABLE to reliably determine a result for the specimen due to the presence of PCR inhibitor(s) in the specimen submitted.  If clinically indicated, please recollect an additional specimen for testing.    Influenza A Comment Negative Final    Comment: (NOTE) We are UNABLE to reliably determine a result for the specimen due to the presence of PCR inhibitor(s) in the specimen submitted.  If clinically indicated, please recollect an additional specimen for testing.    Influenza B Comment Negative Final    Comment:  (NOTE) We are UNABLE to reliably determine a result for the specimen due to the presence of PCR inhibitor(s) in the specimen submitted.  If clinically indicated, please recollect an additional specimen for testing.    Parainfluenza 1 Comment Negative Final    Comment: (NOTE) We are UNABLE to reliably determine a result for the specimen due to the presence of PCR inhibitor(s) in the specimen submitted.  If clinically indicated, please recollect an additional specimen for testing.    Parainfluenza 2 Comment Negative Final    Comment: (NOTE) We are UNABLE to reliably determine a result for the specimen due to the presence of PCR inhibitor(s) in the specimen submitted.  If clinically indicated, please recollect an additional specimen for testing.    Parainfluenza 3 Comment Negative Final    Comment: (NOTE) We are UNABLE to reliably determine a result for the specimen due to the presence of PCR inhibitor(s) in the specimen submitted.  If clinically indicated, please recollect an additional specimen for testing.    Metapneumovirus Comment Negative Final    Comment: (NOTE) We are UNABLE to reliably determine a result for the specimen due to the presence of PCR inhibitor(s) in the specimen submitted.  If clinically indicated, please recollect an additional specimen for testing.    Rhinovirus Comment Negative Final    Comment: (NOTE) We are UNABLE to reliably determine a result for the specimen due to the presence of PCR inhibitor(s) in the specimen submitted.  If clinically indicated, please recollect an additional specimen for testing.    Adenovirus Comment Negative Final    Comment: (NOTE) We are UNABLE to reliably determine a result for the specimen due to the presence of PCR inhibitor(s) in the specimen submitted.  If clinically indicated, please recollect an additional specimen for testing. Performed At: [facility] [address] [place], [place]  [id number] Mila Homer MD FA:[id number]   Culture, body fluid-bottle     Status: None   Collection Time: 05/31/15  9:29 AM  Result Value Ref Range Status   Specimen Description FLUID PLEURAL RIGHT  Final   Special Requests NONE  Final   Culture NO GROWTH 5 DAYS  Final   Report Status 06/05/2015 FINAL  Final  Gram stain     Status: None   Collection Time: 05/31/15  9:29 AM  Result Value  Ref Range Status   Specimen Description FLUID PLEURAL RIGHT  Final   Special Requests NONE  Final   Gram Stain   Final    FEW WBC PRESENT, PREDOMINANTLY PMN NO ORGANISMS SEEN    Report Status 05/31/2015 FINAL  Final  Culture, sputum-assessment     Status: None   Collection Time: 06/01/15  6:57 AM  Result Value Ref Range Status   Specimen Description SPUTUM  Final   Special Requests NONE  Final   Sputum evaluation   Final    MICROSCOPIC FINDINGS SUGGEST THAT THIS SPECIMEN IS NOT REPRESENTATIVE OF LOWER RESPIRATORY SECRETIONS. PLEASE RECOLLECT. Gram Stain Report Called to,Read Back By and Verified With: K PRICE,RN AT 1610 06/01/15 BY L BENFIELD    Report Status 06/01/2015 FINAL  Final  Culture, expectorated sputum-assessment     Status: None   Collection Time: 06/01/15 12:01 PM  Result Value Ref Range Status   Specimen Description SPUTUM  Final   Special Requests NONE  Final   Sputum evaluation   Final    MICROSCOPIC FINDINGS SUGGEST THAT THIS SPECIMEN IS NOT REPRESENTATIVE OF LOWER RESPIRATORY SECRETIONS. PLEASE RECOLLECT. RESULT CALLED TO, READ BACK BY AND VERIFIED WITH: K PRICE 06/01/15 @ 1234 M VESTAL    Report Status 06/01/2015 FINAL  Final  Acid Fast Smear (AFB)     Status: None   Collection Time: 06/02/15 10:02 AM  Result Value Ref Range Status   AFB Specimen Processing Concentration  Final   Acid Fast Smear NEGATIVE  Final    Comment: Performed At: [facility] [address] [place], [place] [id number] Mila Homer MD Ph: [phone]    Source (AFB) FLUID  Final     Comment: RIGHT PLEURAL   Acid Fast Smear (AFB)     Status: None   Collection Time: 06/02/15  5:24 PM  Result Value Ref Range Status   AFB Specimen Processing Concentration  Final   Acid Fast Smear NEGATIVE  Final    Comment: Performed At: [facility] [address] [place], [place] [id number] Mila Homer MD Ph:  [phone]    Source (AFB) SPUTUM  Final  Acid Fast Smear (AFB)     Status: None   Collection Time: 06/03/15  1:04 AM  Result Value Ref Range Status   AFB Specimen Processing Concentration  Final   Acid Fast Smear Negative  Final    Comment: (NOTE) Performed At: [facility] [address] [place], [place] [id number] Mila Homer MD UV:[id number]    Source (AFB) EXPECTORATED SPUTUM  Final     Labs: Basic Metabolic Panel:  Recent Labs Lab 05/30/15 2007 06/01/15 0717  NA 139 142  K 3.7 3.9  CL 103 103  CO2 25 26  GLUCOSE 95 108*  BUN 6 <5*  CREATININE 0.56 0.58  CALCIUM 8.9 8.8*   Liver Function Tests:  Recent Labs Lab 06/01/15 0717  AST 15  ALT 16  ALKPHOS 82  BILITOT 0.2*  PROT 7.0  ALBUMIN 2.5*   CBC:  Recent Labs Lab 05/30/15 2007 06/01/15 0717  WBC 6.6 4.6  NEUTROABS 4.1 2.7  HGB 8.4* 8.0*  HCT 28.0* 26.9*  MCV 70.4* 70.8*  PLT 369 329   This discharge summary was faxed to the [facility] on 2/28 in attention of Dr. Gordy Councilman.  SignedPamella Pert  [facility] 06/06/2015, 6:02 PM

## 2015-06-06 NOTE — Care Management Note (Signed)
Case Management Note  Patient Details  Name: Sheena Ramirez MRN: [id number] Date of Birth: 1970-08-13  Subjective/Objective:                    Action/Plan: Per bedside RN, Dr Cruzita Lederer has set patient up with the Health Department for f/u with her TB. CM met with the patient and her husband and reiterated the use of her Medicaid card for meds and went over her PCP again (with an interpretor). 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 Bedside RN updated.   Expected Discharge Date:                  Expected Discharge Plan:  Home/Self Care  In-House Referral:     Discharge planning Services  CM Consult, [facility]  Post Acute Care Choice:    Choice offered to:     DME Arranged:    DME Agency:     HH Arranged:    HH Agency:     Status of Service:  In process, will continue to follow  Medicare Important Message Given:    Date Medicare IM Given:    Medicare IM give by:    Date Additional Medicare IM Given:    Additional Medicare Important Message give by:     If discussed at [place] of Stay Meetings, dates discussed:    Additional Comments:  Pollie Friar, RN 06/06/2015, 5:16 PM

## 2015-06-06 NOTE — Discharge Instructions (Signed)
Follow with [facility] in 1 day  Please get a complete blood count and chemistry panel checked by your Primary MD at your next visit, and again as instructed by your Primary MD. Please get your medications reviewed and adjusted by your Primary MD.  Please request your Primary MD to go over all Hospital Tests and Procedure/Radiological results at the follow up, please get all Hospital records sent to your Prim MD by signing hospital release before you go home.  If you had Pneumonia of Lung problems at the Hospital: Please get a 2 view Chest X ray done in 6-8 weeks after hospital discharge or sooner if instructed by your Primary MD.  If you have Congestive Heart Failure: Please call your Cardiologist or Primary MD anytime you have any of the following symptoms:  1) 3 pound weight gain in 24 hours or 5 pounds in 1 week  2) shortness of breath, with or without a dry hacking cough  3) swelling in the hands, feet or stomach  4) if you have to sleep on extra pillows at night in order to breathe  Follow cardiac low salt diet and 1.5 lit/day fluid restriction.  If you have diabetes Accuchecks 4 times/day, Once in AM empty stomach and then before each meal. Log in all results and show them to your primary doctor at your next visit. If any glucose reading is under 80 or above 300 call your primary MD immediately.  If you have Seizure/Convulsions/Epilepsy: Please do not drive, operate heavy machinery, participate in activities at heights or participate in high speed sports until you have seen by Primary MD or a Neurologist and advised to do so again.  If you had Gastrointestinal Bleeding: Please ask your Primary MD to check a complete blood count within one week of discharge or at your next visit. Your endoscopic/colonoscopic biopsies that are pending at the time of discharge, will also need to followed by your Primary MD.  Get Medicines reviewed and adjusted. Please take all your  medications with you for your next visit with your Primary MD  Please request your Primary MD to go over all hospital tests and procedure/radiological results at the follow up, please ask your Primary MD to get all Hospital records sent to his/her office.  If you experience worsening of your admission symptoms, develop shortness of breath, life threatening emergency, suicidal or homicidal thoughts you must seek medical attention immediately by calling 911 or calling your MD immediately  if symptoms less severe.  You must read complete instructions/literature along with all the possible adverse reactions/side effects for all the Medicines you take and that have been prescribed to you. Take any new Medicines after you have completely understood and accpet all the possible adverse reactions/side effects.   Do not drive or operate heavy machinery when taking Pain medications.   Do not take more than prescribed Pain, Sleep and Anxiety Medications  Special Instructions: If you have smoked or chewed Tobacco  in the last 2 yrs please stop smoking, stop any regular Alcohol  and or any Recreational drug use.  Wear Seat belts while driving.  Please note You were cared for by a hospitalist during your hospital stay. If you have any questions about your discharge medications or the care you received while you were in the hospital after you are discharged, you can call the unit and asked to speak with the hospitalist on call if the hospitalist that took care of you is not available. Once  you are discharged, your primary care physician will handle any further medical issues. Please note that NO REFILLS for any discharge medications will be authorized once you are discharged, as it is imperative that you return to your primary care physician (or establish a relationship with a primary care physician if you do not have one) for your aftercare needs so that they can reassess your need for medications and monitor your  lab values.  You can reach the hospitalist office at phone [phone] or fax [phone]   If you do not have a primary care physician, you can call [phone] for a physician referral.  Activity: As tolerated with Full fall precautions use walker/cane & assistance as needed  Diet: regular  Disposition Home

## 2015-06-06 NOTE — Progress Notes (Signed)
Subjective: No new complaints, c/o right shoulder pain   Antibiotics:  Anti-infectives    Start     Dose/Rate Route Frequency Ordered Stop   06/06/15 1630  ethambutol (MYAMBUTOL) tablet 1,600 mg     1,600 mg Oral Daily 06/06/15 1615     06/06/15 1630  rifampin (RIFADIN) capsule 600 mg     600 mg Oral Daily 06/06/15 1615     06/06/15 1630  isoniazid (NYDRAZID) tablet 300 mg     300 mg Oral Daily 06/06/15 1615     06/06/15 1630  pyrazinamide tablet 2,000 mg     2,000 mg Oral Daily 06/06/15 1615     06/01/15 0600  azithromycin (ZITHROMAX) 500 mg in dextrose 5 % 250 mL IVPB  Status:  Discontinued     500 mg 250 mL/hr over 60 Minutes Intravenous Every 24 hours 05/31/15 1229 06/01/15 1543   06/01/15 0600  cefTRIAXone (ROCEPHIN) 1 g in dextrose 5 % 50 mL IVPB  Status:  Discontinued     1 g 100 mL/hr over 30 Minutes Intravenous Every 24 hours 05/31/15 1229 06/06/15 0909   05/31/15 1230  cefTRIAXone (ROCEPHIN) 1 g in dextrose 5 % 50 mL IVPB  Status:  Discontinued     1 g 100 mL/hr over 30 Minutes Intravenous Every 24 hours 05/31/15 1221 05/31/15 1229   05/31/15 1230  azithromycin (ZITHROMAX) 500 mg in dextrose 5 % 250 mL IVPB  Status:  Discontinued     500 mg 250 mL/hr over 60 Minutes Intravenous Every 24 hours 05/31/15 1221 05/31/15 1229   05/31/15 0530  cefTRIAXone (ROCEPHIN) 1 g in dextrose 5 % 50 mL IVPB     1 g 100 mL/hr over 30 Minutes Intravenous  Once 05/31/15 0515 05/31/15 0609   05/31/15 0530  azithromycin (ZITHROMAX) 500 mg in dextrose 5 % 250 mL IVPB     500 mg 250 mL/hr over 60 Minutes Intravenous  Once 05/31/15 0515 05/31/15 0711      Medications: Scheduled Meds: . enoxaparin (LOVENOX) injection  40 mg Subcutaneous Q24H  . ethambutol  1,600 mg Oral Daily  . ferumoxytol  510 mg Intravenous Weekly  . Influenza vac split quadrivalent PF  0.5 mL Intramuscular Once  . isoniazid  300 mg Oral Daily  . lisinopril  20 mg Oral Daily  . pyrazinamide  2,000 mg Oral  Daily  . vitamin B-6  50 mg Oral Daily  . rifampin  600 mg Oral Daily   Continuous Infusions: . sodium chloride 10 mL/hr at 06/04/15 1834   PRN Meds:.acetaminophen    Objective: Weight change:  No intake or output data in the 24 hours ending 06/06/15 1617 Blood pressure 120/61, pulse 81, temperature 98.8 F (37.1 C), temperature source Oral, resp. rate 18, height  (1.727 m), weight 246 lb 11.1 oz (111.9 kg), last menstrual period 05/26/2015, SpO2 100 %. Temp:  [97.9 F (36.6 C)-98.8 F (37.1 C)] 98.8 F (37.1 C) (02/27 1413) Pulse Rate:  [70-81] 81 (02/27 1413) Resp:  [16-18] 18 (02/27 1413) BP: (115-120)/(41-61) 120/61 mmHg (02/27 1413) SpO2:  [91 %-100 %] 100 % (02/27 1413)  Physical Exam: General: Alert and awake, oriented x3, not in any acute distress. HEENT: anicteric sclera, EOMI, oropharynx clear and without exudate Cardiovascular: tegular rate, normal r, no murmur rubs or gallops Pulmonary: Diminished breath sounds in the right side .  Gastrointestinal: soft nontender, nondistended, normal bowel sounds, Neuro: nonfocal  CBC:  CBC Latest  Ref Rng 06/01/2015 05/30/2015 05/18/2015  WBC 4.0 - 10.5 K/uL 4.6 6.6 6.8  Hemoglobin 12.0 - 15.0 g/dL 8.0(L) 8.4(L) 8.6(L)  Hematocrit 36.0 - 46.0 % 26.9(L) 28.0(L) 29.7(L)  Platelets 150 - 400 K/uL 329 369 260       BMET No results for input(s): NA, K, CL, CO2, GLUCOSE, BUN, CREATININE, CALCIUM in the last 72 hours.   Liver Panel  No results for input(s): PROT, ALBUMIN, AST, ALT, ALKPHOS, BILITOT, BILIDIR, IBILI in the last 72 hours.     Sedimentation Rate No results for input(s): ESRSEDRATE in the last 72 hours. C-Reactive Protein No results for input(s): CRP in the last 72 hours.  Micro Results: Recent Results (from the past 720 hour(s))  Culture, blood (routine x 2)     Status: None   Collection Time: 05/31/15  7:52 AM  Result Value Ref Range Status   Specimen Description BLOOD LEFT ANTECUBITAL  Final     Special Requests BOTTLES DRAWN AEROBIC AND ANAEROBIC 10CC  Final   Culture NO GROWTH 5 DAYS  Final   Report Status 06/05/2015 FINAL  Final  Culture, blood (routine x 2)     Status: None   Collection Time: 05/31/15  8:02 AM  Result Value Ref Range Status   Specimen Description BLOOD LEFT HAND  Final   Special Requests BOTTLES DRAWN AEROBIC ONLY 10CC  Final   Culture NO GROWTH 5 DAYS  Final   Report Status 06/05/2015 FINAL  Final  Respiratory virus panel     Status: None   Collection Time: 05/31/15  8:27 AM  Result Value Ref Range Status   Source - RVPAN NASOPHARYNGEAL  Corrected   Respiratory Syncytial Virus A Comment Negative Final    Comment: (NOTE) We are UNABLE to reliably determine a result for the specimen due to the presence of PCR inhibitor(s) in the specimen submitted.  If clinically indicated, please recollect an additional specimen for testing.    Respiratory Syncytial Virus B Comment Negative Final    Comment: (NOTE) We are UNABLE to reliably determine a result for the specimen due to the presence of PCR inhibitor(s) in the specimen submitted.  If clinically indicated, please recollect an additional specimen for testing.    Influenza A Comment Negative Final    Comment: (NOTE) We are UNABLE to reliably determine a result for the specimen due to the presence of PCR inhibitor(s) in the specimen submitted.  If clinically indicated, please recollect an additional specimen for testing.    Influenza B Comment Negative Final    Comment: (NOTE) We are UNABLE to reliably determine a result for the specimen due to the presence of PCR inhibitor(s) in the specimen submitted.  If clinically indicated, please recollect an additional specimen for testing.    Parainfluenza 1 Comment Negative Final    Comment: (NOTE) We are UNABLE to reliably determine a result for the specimen due to the presence of PCR inhibitor(s) in the specimen submitted.  If clinically indicated,  please recollect an additional specimen for testing.    Parainfluenza 2 Comment Negative Final    Comment: (NOTE) We are UNABLE to reliably determine a result for the specimen due to the presence of PCR inhibitor(s) in the specimen submitted.  If clinically indicated, please recollect an additional specimen for testing.    Parainfluenza 3 Comment Negative Final    Comment: (NOTE) We are UNABLE to reliably determine a result for the specimen due to the presence of PCR inhibitor(s) in the specimen  submitted.  If clinically indicated, please recollect an additional specimen for testing.    Metapneumovirus Comment Negative Final    Comment: (NOTE) We are UNABLE to reliably determine a result for the specimen due to the presence of PCR inhibitor(s) in the specimen submitted.  If clinically indicated, please recollect an additional specimen for testing.    Rhinovirus Comment Negative Final    Comment: (NOTE) We are UNABLE to reliably determine a result for the specimen due to the presence of PCR inhibitor(s) in the specimen submitted.  If clinically indicated, please recollect an additional specimen for testing.    Adenovirus Comment Negative Final    Comment: (NOTE) We are UNABLE to reliably determine a result for the specimen due to the presence of PCR inhibitor(s) in the specimen submitted.  If clinically indicated, please recollect an additional specimen for testing. Performed At: [facility] [address] [place], [place] [id number] Mila Homer MD WU:[id number]   Culture, body fluid-bottle     Status: None   Collection Time: 05/31/15  9:29 AM  Result Value Ref Range Status   Specimen Description FLUID PLEURAL RIGHT  Final   Special Requests NONE  Final   Culture NO GROWTH 5 DAYS  Final   Report Status 06/05/2015 FINAL  Final  Gram stain     Status: None   Collection Time: 05/31/15  9:29 AM  Result Value Ref Range Status   Specimen Description FLUID  PLEURAL RIGHT  Final   Special Requests NONE  Final   Gram Stain   Final    FEW WBC PRESENT, PREDOMINANTLY PMN NO ORGANISMS SEEN    Report Status 05/31/2015 FINAL  Final  Culture, sputum-assessment     Status: None   Collection Time: 06/01/15  6:57 AM  Result Value Ref Range Status   Specimen Description SPUTUM  Final   Special Requests NONE  Final   Sputum evaluation   Final    MICROSCOPIC FINDINGS SUGGEST THAT THIS SPECIMEN IS NOT REPRESENTATIVE OF LOWER RESPIRATORY SECRETIONS. PLEASE RECOLLECT. Gram Stain Report Called to,Read Back By and Verified With: K PRICE,RN AT [id number] 06/01/15 BY L BENFIELD    Report Status 06/01/2015 FINAL  Final  Culture, expectorated sputum-assessment     Status: None   Collection Time: 06/01/15 12:01 PM  Result Value Ref Range Status   Specimen Description SPUTUM  Final   Special Requests NONE  Final   Sputum evaluation   Final    MICROSCOPIC FINDINGS SUGGEST THAT THIS SPECIMEN IS NOT REPRESENTATIVE OF LOWER RESPIRATORY SECRETIONS. PLEASE RECOLLECT. RESULT CALLED TO, READ BACK BY AND VERIFIED WITH: K PRICE 06/01/15 @ 1234 M VESTAL    Report Status 06/01/2015 FINAL  Final  Acid Fast Smear (AFB)     Status: None   Collection Time: 06/02/15 10:02 AM  Result Value Ref Range Status   AFB Specimen Processing Concentration  Final   Acid Fast Smear NEGATIVE  Final    Comment: Performed At: [facility] [address] [place], [place] [id number] Mila Homer MD Ph: [phone]    Source (AFB) FLUID  Final    Comment: RIGHT PLEURAL   Acid Fast Smear (AFB)     Status: None   Collection Time: 06/02/15  5:24 PM  Result Value Ref Range Status   AFB Specimen Processing Concentration  Final   Acid Fast Smear NEGATIVE  Final    Comment: Performed At: [facility] [address] [place], [place] [id number] Mila Homer MD  Ph:  [phone]    Source (AFB) SPUTUM  Final  Acid Fast Smear (AFB)     Status: None   Collection Time: 06/03/15   1:04 AM  Result Value Ref Range Status   AFB Specimen Processing Concentration  Final   Acid Fast Smear Negative  Final    Comment: (NOTE) Performed At: [facility] [address] [place], [place] [id number] Mila Homer MD WG:[id number]    Source (AFB) EXPECTORATED SPUTUM  Final    Studies/Results: No results found.    Assessment/Plan:  INTERVAL HISTORY:  Pt sp thoracocentesis   Principal Problem:   Acute respiratory failure (HCC) Active Problems:   Pleural effusion, right   Pleuritic chest pain   HTN (hypertension)   Microcytic anemia   Language barrier, cultural differences   Hyperglycemia   Community acquired pneumonia   S/P thoracentesis   Exudative pleural effusion   FUO (fever of unknown origin)   Right shoulder pain   Extrapulmonary tuberculosis    Sheena Ramirez is a 45 y.o. female  of [place] origin with year long hx of night sweats, prior shoulder pain treated we believe with injectable steroids, then several week to possible 2 month hx of abdominal pain, with aggressive dyspnea on exertion and fevers now found to have a very large right-sided pleural effusion with right lower lobe consolidation. Pleural fluid characteristics are consistent with an exudative pleural effusion though not with an empyema. There is concern rightfully so for possible tuberculosis effusion.  #1 Exudative pleural effusion with concerns for possible tuberculosis effusion She is sp repeat thoracocentesis.   Pleural ADA normal "" MTB PCR negative   AFB smear pleural fluid negative "" sputum negative x 2  AFB cultures pending Fungal cultures pending  I had extensive discussion with Dr. Gordy Councilman re the pt today.  Dr. Elvera Lennox offered pt option to proceed with pleural biopsy with CVTS vs starting therapy empirically and she wanted to go ahead and proceed with theraqpy rather than go through surgery.  I have started 4 drug therapy here weight based  I  have d/w Dr. Gordy Councilman and she will need all records faxed to her at [facility] by CM.  Fine to continue rocephin for now  #2 Shoulder pain: likely referred pain from her pleural effusion which may have gone back many months therefore. Xray shoulder underwhelming   I spent greater than 50 minutes with the patient including greater than 50% of time in face to face counsel of the patient and her husband using Arabic translator re her possible TB effusion, possible pulmonary TB and in coordination of their care with Dr. Elvera Lennox and Dr. Gordy Councilman.  I will arrange HSFU with me in the next 4 weeks.   Dr. Orvan Falconer is available for questions over the weekend and I will be back on Monday   LOS: 6 days   Acey Lav 06/06/2015, 4:17 PM

## 2015-06-07 LAB — MISC LABCORP TEST (SEND OUT): Labcorp test code: 183456

## 2015-06-07 LAB — ACID FAST SMEAR (AFB, MYCOBACTERIA)

## 2015-06-07 LAB — ACID FAST SMEAR (AFB): ACID FAST SMEAR - AFSCU2: NEGATIVE

## 2015-06-08 LAB — ACID FAST SMEAR (AFB, MYCOBACTERIA): Acid Fast Smear: NEGATIVE

## 2015-06-08 LAB — ACID FAST SMEAR (AFB)

## 2015-06-09 LAB — FUNGUS CULTURE WITH STAIN

## 2015-06-09 LAB — FUNGAL ORGANISM REFLEX

## 2015-06-09 LAB — FUNGUS CULTURE RESULT

## 2015-06-10 LAB — ACID FAST SMEAR (AFB): ACID FAST SMEAR - AFSCU2: NEGATIVE

## 2015-06-10 LAB — ACID FAST SMEAR (AFB, MYCOBACTERIA)

## 2015-06-13 LAB — ACID FAST SMEAR (AFB): ACID FAST SMEAR - AFSCU2: NEGATIVE

## 2015-06-13 LAB — ACID FAST SMEAR (AFB, MYCOBACTERIA)

## 2015-06-20 ENCOUNTER — Ambulatory Visit (INDEPENDENT_AMBULATORY_CARE_PROVIDER_SITE_OTHER): Payer: Medicaid Other | Admitting: Family Medicine

## 2015-06-20 ENCOUNTER — Encounter: Payer: Self-pay | Admitting: Family Medicine

## 2015-06-20 VITALS — BP 143/53 | Temp 98.2°F | Resp 87 | Ht 71.0 in | Wt 241.0 lb

## 2015-06-20 DIAGNOSIS — I10 Essential (primary) hypertension: Secondary | ICD-10-CM | POA: Diagnosis not present

## 2015-06-20 DIAGNOSIS — D649 Anemia, unspecified: Secondary | ICD-10-CM | POA: Diagnosis not present

## 2015-06-20 LAB — LIPID PANEL
CHOL/HDL RATIO: 4.4 ratio (ref <=5.0)
Cholesterol: 193 mg/dL (ref 125–200)
HDL: 44 mg/dL — AB (ref >=46)
LDL CALC: 113 mg/dL (ref <130)
Triglycerides: 178 mg/dL — ABNORMAL HIGH (ref <150)
VLDL: 36 mg/dL — ABNORMAL HIGH (ref <30)

## 2015-06-20 LAB — COMPLETE METABOLIC PANEL WITH GFR
ALT: 11 U/L (ref 6–29)
AST: 12 U/L (ref 10–30)
Albumin: 3.5 g/dL — ABNORMAL LOW (ref 3.6–5.1)
Alkaline Phosphatase: 83 U/L (ref 33–115)
BUN: 6 mg/dL — AB (ref 7–25)
CHLORIDE: 104 mmol/L (ref 98–110)
CO2: 26 mmol/L (ref 20–31)
CREATININE: 0.51 mg/dL (ref 0.50–1.10)
Calcium: 9.2 mg/dL (ref 8.6–10.2)
GFR, Est Non African American: >89 mL/min (ref >=60)
GLUCOSE: 105 mg/dL — AB (ref 65–99)
Potassium: 4.2 mmol/L (ref 3.5–5.3)
Sodium: 139 mmol/L (ref 135–146)
Total Bilirubin: 0.2 mg/dL (ref 0.2–1.2)
Total Protein: 7.3 g/dL (ref 6.1–8.1)

## 2015-06-20 LAB — TSH: TSH: 0.71 m[IU]/L

## 2015-06-20 MED ORDER — FERROUS SULFATE 325 (65 FE) MG PO TBEC: 325.0000 mg | DELAYED_RELEASE_TABLET | Freq: Three times a day (TID) | ORAL | Status: AC

## 2015-06-20 MED ORDER — LISINOPRIL 10 MG PO TABS: 20.0000 mg | ORAL_TABLET | Freq: Every day | ORAL | Status: AC

## 2015-06-20 NOTE — Progress Notes (Signed)
Patient ID: Sheena KaufmannFatima Ibrahim Musa Ramirez, female   DOB: 05/17/1970, 45 y.o.   MRN: [id number]   Sheena Ramirez, is a 45 y.o. female  [id number]  [id number]  DOB - 07/12/1970  CC:  Chief Complaint  Patient presents with  . new patient/get established    Hospital follow up, feels better, needs anemia checked and meds reviewed        HPI: Sheena Ramirez is a 45 y.o. female here to establish care.  She was recently in the hospital from 2-21 through 2-27. She was diagnosed and treated for CAP and pleural effusion. It was also discovered that she has extrapulmonary TB. She is currently being treated with ethambutol, isoniazid, pyranamide and rifampin. She has picked up her medications from the health department and reports she is taking as prescribed. She has a follow-up appointment later this month with Dr. Daiva EvesVan Dam with ID. She also was diagnosed with aemia and is being treated with ferrous sulfate 325 bid. She has hypertension and is on lisinopril 10 mg daily. She needs refills on lisinopril and will need refills on ferrous sulfate soon.   Health Maintenance: She has had a flu shot for this season. She reports having 4 immunizations when she came to this country in October but is unsure what immunizations. She is in need of PAP and Mammogram. She reports being up to date on colon cancer screening. No Known Allergies Past Medical History  Diagnosis Date  . Hypertension   . Anemia   . Pleural effusion 05/2015  . Extrapulmonary tuberculosis 06/06/2015   Current Outpatient Prescriptions on File Prior to Visit  Medication Sig Dispense Refill  . ethambutol (MYAMBUTOL) 400 MG tablet Take 4 tablets (1,600 mg total) by mouth daily.    Marland Kitchen. isoniazid (NYDRAZID) 300 MG tablet Take 1 tablet (300 mg total) by mouth daily.    . ondansetron (ZOFRAN) 4 MG tablet Take 1 tablet (4 mg total) by mouth every 8 (eight) hours as needed for nausea or vomiting. 20 tablet 0  . pyrazinamide 500 MG tablet Take 4 tablets  (2,000 mg total) by mouth daily.    Marland Kitchen. pyridOXINE (B-6) 50 MG tablet Take 1 tablet (50 mg total) by mouth daily.    . rifampin (RIFADIN) 300 MG capsule Take 2 capsules (600 mg total) by mouth daily.    . traMADol (ULTRAM) 50 MG tablet Take 1 tablet (50 mg total) by mouth every 6 (six) hours as needed. 30 tablet 0   No current facility-administered medications on file prior to visit.   Family History  Problem Relation Age of Onset  . Anemia Mother   . Heart disease Mother    Social History   Social History  . Marital Status: Married    Spouse Name: N/A  . Number of Children: N/A  . Years of Education: N/A   Occupational History  . Not on file.   Social History Main Topics  . Smoking status: Never Smoker   . Smokeless tobacco: Never Used  . Alcohol Use: No  . Drug Use: No  . Sexual Activity: Not on file   Other Topics Concern  . Not on file   Social History Narrative    Review of Systems: Constitutional: Negative for fever, chills, appetite change, weight loss,  Fatigue. Skin: Negative for rashes or lesions of concern. HENT: Negative for ear pain, ear discharge.nose bleeds Eyes: Negative for pain, discharge, redness, itching and visual disturbance. Neck: Negative for pain, stiffness Respiratory: Negative for cough, shortness  of breath,   Cardiovascular: Negative for chest pain, palpitations and leg swelling. Gastrointestinal: Negative for abdominal pain, nausea, vomiting, diarrhea, constipations Genitourinary: Negative for dysuria, urgency, frequency, hematuria,  Musculoskeletal: Negative for back pain, joint pain, joint  swelling, and gait problem.Negative for weakness. Neurological: Negative for dizziness, tremors, seizures, syncope,   light-headedness, numbness and headaches.  Hematological: Negative for easy bruising or bleeding Psychiatric/Behavioral: Negative for depression, anxiety, decreased concentration, confusion   Objective:   Filed Vitals:   06/20/15  1035  BP: 143/53  Temp: 98.2 F (36.8 C)  Resp: 87    Physical Exam: Constitutional: Patient appears well-developed and well-nourished. No distress. HENT: Normocephalic, atraumatic, External right and left ear normal. Oropharynx is clear and moist.  Eyes: Conjunctivae and EOM are normal. PERRLA, no scleral icterus. Neck: Normal ROM. Neck supple. No lymphadenopathy, No thyromegaly. CVS: RRR, S1/S2 +, no murmurs, no gallops, no rubs Pulmonary: Effort and breath sounds normal, no stridor, rhonchi, wheezes, rales.  Abdominal: Soft. Normoactive BS,, no distension, tenderness, rebound or guarding.  Musculoskeletal: Normal range of motion. No edema and no tenderness.  Neuro: Alert.Normal muscle tone coordination. Non-focal Skin: Skin is warm and dry. No rash noted. Not diaphoretic. No erythema. No pallor. Psychiatric: Normal mood and affect. Behavior, judgment, thought content normal.  Lab Results  Component Value Date   WBC 4.6 06/01/2015   HGB 8.0* 06/01/2015   HCT 26.9* 06/01/2015   MCV 70.8* 06/01/2015   PLT 329 06/01/2015   Lab Results  Component Value Date   CREATININE 0.58 06/01/2015   BUN <5* 06/01/2015   NA 142 06/01/2015   K 3.9 06/01/2015   CL 103 06/01/2015   CO2 26 06/01/2015    Lab Results  Component Value Date   HGBA1C 6.2* 05/31/2015   Lipid Panel  No results found for: CHOL, TRIG, HDL, CHOLHDL, VLDL, LDLCALC     Assessment and plan:   1. Essential hypertension  - COMPLETE METABOLIC PANEL WITH GFR - CBC with Differential - Lipid panel - TSH - lisinopril (PRINIVIL,ZESTRIL) 10 MG tablet; Take 2 tablets (20 mg total) by mouth daily.  Dispense: 90 tablet; Refill: 3  2. Anemia, unspecified anemia type  - CBC with Differential - ferrous sulfate 325 (65 FE) MG EC tablet; Take 1 tablet (325 mg total) by mouth 3 (three) times daily with meals.  Dispense: 90 tablet; Refill: 1   Return in about 4 weeks (around 07/18/2015).  The patient was given clear  instructions to go to ER or return to medical center if symptoms don't improve, worsen or new problems develop. The patient verbalized understanding.    Henrietta Hoover FNP  06/20/2015, 11:54 AM

## 2015-06-20 NOTE — Patient Instructions (Addendum)
Please bring your medicines on your next visit Bring copy of immunizations when you come next time.  Call [phone] to schedule an appointment for a mammogram Make an appointment to come back within next few weeks for a PAP smear.

## 2015-06-21 LAB — CBC WITH DIFFERENTIAL/PLATELET
BASOS ABS: 0 10*3/uL (ref 0.0–0.1)
Basophils Relative: 1 % (ref 0–1)
EOS PCT: 2 % (ref 0–5)
Eosinophils Absolute: 0.1 10*3/uL (ref 0.0–0.7)
HEMATOCRIT: 32.5 % — AB (ref 36.0–46.0)
Hemoglobin: 10 g/dL — ABNORMAL LOW (ref 12.0–15.0)
LYMPHS ABS: 1 10*3/uL (ref 0.7–4.0)
LYMPHS PCT: 21 % (ref 12–46)
MCH: 22.7 pg — AB (ref 26.0–34.0)
MCHC: 30.8 g/dL (ref 30.0–36.0)
MCV: 73.7 fL — AB (ref 78.0–100.0)
MONOS PCT: 9 % (ref 3–12)
MPV: 9.7 fL (ref 8.6–12.4)
Monocytes Absolute: 0.4 10*3/uL (ref 0.1–1.0)
Neutro Abs: 3.1 10*3/uL (ref 1.7–7.7)
Neutrophils Relative %: 67 % (ref 43–77)
Platelets: 264 10*3/uL (ref 150–400)
RBC: 4.41 MIL/uL (ref 3.87–5.11)
RDW: 25 % — AB (ref 11.5–15.5)
WBC: 4.7 10*3/uL (ref 4.0–10.5)

## 2015-06-22 ENCOUNTER — Other Ambulatory Visit: Payer: Self-pay | Admitting: Family Medicine

## 2015-06-22 DIAGNOSIS — Z1231 Encounter for screening mammogram for malignant neoplasm of breast: Secondary | ICD-10-CM

## 2015-07-01 ENCOUNTER — Ambulatory Visit
Admission: RE | Admit: 2015-07-01 | Discharge: 2015-07-01 | Disposition: A | Discharge status: Home / Self Care | Payer: Medicaid Other | Source: Ambulatory Visit | Attending: Family Medicine | Admitting: Family Medicine

## 2015-07-01 DIAGNOSIS — Z1231 Encounter for screening mammogram for malignant neoplasm of breast: Secondary | ICD-10-CM

## 2015-07-06 ENCOUNTER — Inpatient Hospital Stay: Payer: Medicaid Other | Admitting: Infectious Disease

## 2015-07-06 ENCOUNTER — Other Ambulatory Visit: Payer: Self-pay | Admitting: Family Medicine

## 2015-07-06 DIAGNOSIS — R928 Other abnormal and inconclusive findings on diagnostic imaging of breast: Secondary | ICD-10-CM

## 2015-07-06 LAB — FUNGUS CULTURE RESULT

## 2015-07-06 LAB — FUNGUS CULTURE WITH STAIN

## 2015-07-06 LAB — FUNGAL ORGANISM REFLEX

## 2015-07-19 LAB — ACID FAST CULTURE WITH REFLEXED SENSITIVITIES
ACID FAST CULTURE - AFSCU3: NEGATIVE
ACID FAST CULTURE - AFSCU3: NEGATIVE
ACID FAST CULTURE - AFSCU3: NEGATIVE

## 2015-07-19 LAB — ACID FAST CULTURE WITH REFLEXED SENSITIVITIES (MYCOBACTERIA)

## 2015-07-28 ENCOUNTER — Other Ambulatory Visit: Payer: Self-pay | Admitting: Family Medicine

## 2015-07-28 ENCOUNTER — Other Ambulatory Visit: Payer: Self-pay

## 2015-07-28 DIAGNOSIS — R928 Other abnormal and inconclusive findings on diagnostic imaging of breast: Secondary | ICD-10-CM

## 2015-07-28 LAB — ACID FAST CULTURE WITH REFLEXED SENSITIVITIES

## 2015-07-28 LAB — ACID FAST CULTURE WITH REFLEXED SENSITIVITIES (MYCOBACTERIA): Acid Fast Culture: NEGATIVE

## 2015-07-29 ENCOUNTER — Ambulatory Visit
Admission: RE | Admit: 2015-07-29 | Discharge: 2015-07-29 | Disposition: A | Discharge status: Home / Self Care | Payer: Medicaid Other | Source: Ambulatory Visit | Attending: Family Medicine | Admitting: Family Medicine

## 2015-07-29 DIAGNOSIS — R928 Other abnormal and inconclusive findings on diagnostic imaging of breast: Secondary | ICD-10-CM

## 2015-08-03 LAB — ACID FAST CULTURE WITH REFLEXED SENSITIVITIES: ACID FAST CULTURE - AFSCU3: NEGATIVE

## 2015-08-17 DIAGNOSIS — M545 Low back pain, unspecified: Secondary | ICD-10-CM

## 2015-09-02 ENCOUNTER — Other Ambulatory Visit: Payer: Self-pay | Admitting: Infectious Disease

## 2015-09-02 ENCOUNTER — Ambulatory Visit
Admission: RE | Admit: 2015-09-02 | Discharge: 2015-09-02 | Disposition: A | Discharge status: Home / Self Care | Payer: No Typology Code available for payment source | Source: Ambulatory Visit | Attending: Infectious Disease | Admitting: Infectious Disease

## 2015-09-02 DIAGNOSIS — Z09 Encounter for follow-up examination after completed treatment for conditions other than malignant neoplasm: Secondary | ICD-10-CM

## 2015-09-02 DIAGNOSIS — A150 Tuberculosis of lung: Secondary | ICD-10-CM

## 2015-09-09 LAB — ACID FAST CULTURE WITH REFLEXED SENSITIVITIES (MYCOBACTERIA): Acid Fast Culture: NEGATIVE

## 2015-09-13 DIAGNOSIS — M2550 Pain in unspecified joint: Secondary | ICD-10-CM

## 2015-09-14 NOTE — Congregational Nurse Program (Unsigned)
Office visit at [facility] with complaint of multiple joint pains in left inner ankle, knees bilaterally with "cracking" sounds upon flexing, and left wrist pain with raised and moveable nodule with pain upon pressure. Wrist nodule approximately 3-4 cm. Callous type lesion on left inner foot arch area painful to pressure walking. Requesting assistance with appointmeent for an orthopedic referral and evaluation.  Refer to PCP at [facility], [address] Ferol LuzMarietta Jossue Rubenstein, RN/CN.

## 2015-09-14 NOTE — Congregational Nurse Program (Unsigned)
Congregational Nurse Program Note  Date of Encounter: 08/17/2015  Past Medical History: Past Medical History  Diagnosis Date  . Hypertension   . Anemia   . Pleural effusion 05/2015  . 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 New   Patient is considered a/an Refugee   Race African   Patient Assistance   Patient's financial/insurance status Medicaid   Uninsured Patient Yes   Interventions Counseled to make appt. with provider   Patient referred to apply for the following financial assistance Not Applicable   Food insecurities addressed Not Applicable   Transportation assistance No   Assistance securing medications No   Educational health offerings Exercise/physical activity;Navigating the healthcare system;Hypertension   Encounter Details   Primary purpose of visit Acute Illness/Condition Visit   Was an Emergency Department visit averted? Not Applicable   Does patient have a medical provider? Yes   Patient referred to Follow up with established PCP   Was a mental health screening completed? (GAINS tool) No   Does patient have dental issues? No   Does patient have vision issues? No   Does your patient have an abnormal blood pressure today? No   Since previous encounter, have you referred patient for abnormal blood pressure that resulted in a new diagnosis or medication change? No   Does your patient have an abnormal blood glucose today? No   Since previous encounter, have you referred patient for abnormal blood glucose that resulted in a new diagnosis or medication change? No   Was there a life-saving intervention made? No     08-17-15. Office visit to for this Arabic speaking African lady with concerns regarding low back pain and blood pressure. Current PCP is [facility] [address] Vitals stable. No recent visit to provider. Plan:Referred  for follow-up with PCP. Appointment Sep 07, 2015 at 10:15am. Return to [facility] office for wgt and BP follow-up. Ferol LuzMarietta Jailan Trimm, RN/CN

## 2015-10-01 ENCOUNTER — Emergency Department (HOSPITAL_COMMUNITY)
Admission: EM | Admit: 2015-10-01 | Discharge: 2015-10-01 | Disposition: A | Discharge status: Home / Self Care | Payer: Medicaid Other | Attending: Emergency Medicine | Admitting: Emergency Medicine

## 2015-10-01 ENCOUNTER — Other Ambulatory Visit: Payer: Self-pay

## 2015-10-01 ENCOUNTER — Encounter (HOSPITAL_COMMUNITY): Payer: Self-pay

## 2015-10-01 DIAGNOSIS — I10 Essential (primary) hypertension: Secondary | ICD-10-CM | POA: Diagnosis not present

## 2015-10-01 DIAGNOSIS — R112 Nausea with vomiting, unspecified: Secondary | ICD-10-CM | POA: Diagnosis present

## 2015-10-01 DIAGNOSIS — E860 Dehydration: Secondary | ICD-10-CM | POA: Diagnosis not present

## 2015-10-01 LAB — CBC
HEMATOCRIT: 33.4 % — AB (ref 36.0–46.0)
Hemoglobin: 10.7 g/dL — ABNORMAL LOW (ref 12.0–15.0)
MCH: 26.8 pg (ref 26.0–34.0)
MCHC: 32 g/dL (ref 30.0–36.0)
MCV: 83.7 fL (ref 78.0–100.0)
Platelets: 205 10*3/uL (ref 150–400)
RBC: 3.99 MIL/uL (ref 3.87–5.11)
RDW: 13.5 % (ref 11.5–15.5)
WBC: 5.1 10*3/uL (ref 4.0–10.5)

## 2015-10-01 LAB — LIPASE, BLOOD: LIPASE: 23 U/L (ref 11–51)

## 2015-10-01 LAB — COMPREHENSIVE METABOLIC PANEL
ALK PHOS: 55 U/L (ref 38–126)
ALT: 16 U/L (ref 14–54)
AST: 25 U/L (ref 15–41)
Albumin: 3.5 g/dL (ref 3.5–5.0)
Anion gap: 8 (ref 5–15)
BUN: 8 mg/dL (ref 6–20)
CHLORIDE: 104 mmol/L (ref 101–111)
CO2: 24 mmol/L (ref 22–32)
Calcium: 8.9 mg/dL (ref 8.9–10.3)
Creatinine, Ser: 0.57 mg/dL (ref 0.44–1.00)
GFR calc Af Amer: >60 mL/min (ref >60)
Glucose, Bld: 144 mg/dL — ABNORMAL HIGH (ref 65–99)
Potassium: 3.5 mmol/L (ref 3.5–5.1)
Sodium: 136 mmol/L (ref 135–145)
Total Bilirubin: 0.4 mg/dL (ref 0.3–1.2)
Total Protein: 7 g/dL (ref 6.5–8.1)

## 2015-10-01 LAB — URINALYSIS, ROUTINE W REFLEX MICROSCOPIC
Bilirubin Urine: NEGATIVE
GLUCOSE, UA: NEGATIVE mg/dL
Hgb urine dipstick: NEGATIVE
Ketones, ur: NEGATIVE mg/dL
LEUKOCYTES UA: NEGATIVE
Nitrite: NEGATIVE
PH: 7 (ref 5.0–8.0)
Protein, ur: NEGATIVE mg/dL
SPECIFIC GRAVITY, URINE: 1.018 (ref 1.005–1.030)

## 2015-10-01 LAB — I-STAT BETA HCG BLOOD, ED (MC, WL, AP ONLY): I-stat hCG, quantitative: <5 m[IU]/mL (ref <5)

## 2015-10-01 LAB — CBG MONITORING, ED: Glucose-Capillary: 126 mg/dL — ABNORMAL HIGH (ref 65–99)

## 2015-10-01 MED ORDER — ONDANSETRON HCL 4 MG PO TABS: 4.0000 mg | ORAL_TABLET | Freq: Three times a day (TID) | ORAL | Status: AC | PRN

## 2015-10-01 MED ORDER — ONDANSETRON HCL 4 MG/2ML IJ SOLN: 4.0000 mg | Freq: Once | INTRAMUSCULAR | Status: DC | PRN

## 2015-10-01 MED ORDER — SODIUM CHLORIDE 0.9 % IV BOLUS (SEPSIS)
2000.0000 mL | Freq: Once | INTRAVENOUS | Status: AC
  Administered 2015-10-01: 2000 mL via INTRAVENOUS

## 2015-10-01 MED ORDER — MECLIZINE HCL 25 MG PO TABS
50.0000 mg | ORAL_TABLET | Freq: Once | ORAL | Status: AC
  Administered 2015-10-01: 50 mg via ORAL
  Filled 2015-10-01: qty 2

## 2015-10-01 MED ORDER — ONDANSETRON HCL 4 MG/2ML IJ SOLN
4.0000 mg | Freq: Once | INTRAMUSCULAR | Status: AC
  Administered 2015-10-01: 4 mg via INTRAVENOUS
  Filled 2015-10-01: qty 2

## 2015-10-01 MED ORDER — MECLIZINE HCL 25 MG PO TABS: 25.0000 mg | ORAL_TABLET | Freq: Three times a day (TID) | ORAL | Status: AC | PRN

## 2015-10-01 NOTE — Discharge Instructions (Signed)
push fluids, if you are feeling ill tomorrow, please break her fast in the day and drink plenty of fluids.   If you have any new or worsening symptoms please return to the emergency room for a reevaluation.  Follow with your primary care doctor in the next week for a checkup.                 .                .            Sheena Ramirez.  Sheena Ramirez, 'iidha kunt tasheur bialmard ghadana, yrja kasr siamiha fi alyawm washarab alkthyr min alsawayil.  'iidha kan ladayk 'ayi 'aerad jadidat 'aw tafaqum yrja aleawdat 'iilaa ghurfat altawari li'iieadat taqyim.  atabie mae tabib alrieayat al'awaliat alkhasi bik fi al'usbue almuqbil lifahs.

## 2015-10-01 NOTE — ED Provider Notes (Signed)
CSN: [id number]     Arrival date & time 10/01/15  1041 History   First MD Initiated Contact with Patient 10/01/15 1044     Chief Complaint  Patient presents with  . Nausea  . Dizziness     (Consider location/radiation/quality/duration/timing/severity/associated sxs/prior Treatment) HPI   Blood pressure 145/81, pulse 73, temperature 98.8 F (37.1 C), temperature source Oral, last menstrual period 08/18/2015, SpO2 100 %.  Adelina MingsFatima Ibrahim Kalman DrapeMusa Badalamenti is a 45 y.o. female with past medical history significant for extrapulmonary tuberculosis, actively undergoing for medication treatment, compliant with her medications with acute onset of nonbloody, nonbilious, non-coffee ground emesis roughly 10 times since waking this morning, she has associated dizziness which she describes as the room spinning, states that the vomiting started and then the dizziness occurred, states it is worse with head movement but it's not worse when she turns either right to left, no prior history of vertigo or similar prior episodes. Patient denies fever, chills, sick contacts, chest pain, cough, shortness of breath, abdominal pain, change in urination, abnormal vaginal discharge, change in defecation. She's been fasting for Ramadan, is breaking her fast nights. Patient states she's been compliant with her TB medications, she denies cough, fever, chills, night sweats, shortness of breath she has been following with infectious disease Dr. Algis LimingVanDam  Past Medical History  Diagnosis Date  . Hypertension   . Anemia   . Pleural effusion 05/2015  . 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 No past surgeries     Family History  Problem Relation Age of Onset  . Anemia Mother   . Heart disease Mother    Social History  Substance Use Topics  . Smoking status: Never Smoker   . Smokeless tobacco: Never Used  . Alcohol Use: No   OB History    No data available     Review  of Systems  10 systems reviewed and found to be negative, except as noted in the HPI.   Allergies  Review of patient's allergies indicates no known allergies.  Home Medications   Prior to Admission medications   Medication Sig Start Date End Date Taking? Authorizing Provider  lisinopril (PRINIVIL,ZESTRIL) 10 MG tablet Take 2 tablets (20 mg total) by mouth daily. Patient taking differently: Take 10 mg by mouth daily.  06/20/15  Yes Henrietta HooverLinda C Bernhardt, NP  ethambutol (MYAMBUTOL) 400 MG tablet Take 4 tablets (1,600 mg total) by mouth daily. Patient not taking: Reported on 10/01/2015 06/06/15   Leatha Gildingostin M Gherghe, MD  ferrous sulfate 325 (65 FE) MG EC tablet Take 1 tablet (325 mg total) by mouth 3 (three) times daily with meals. Patient not taking: Reported on 10/01/2015 06/20/15   Henrietta HooverLinda C Bernhardt, NP  isoniazid (NYDRAZID) 300 MG tablet Take 1 tablet (300 mg total) by mouth daily. Patient not taking: Reported on 10/01/2015 06/06/15   Leatha Gildingostin M Gherghe, MD  meclizine (ANTIVERT) 25 MG tablet Take 1 tablet (25 mg total) by mouth 3 (three) times daily as needed for dizziness. 10/01/15   Truly Stankiewicz, PA-C  ondansetron (ZOFRAN) 4 MG tablet Take 1 tablet (4 mg total) by mouth every 8 (eight) hours as needed for nausea or vomiting. 10/01/15   Joni ReiningNicole Sigifredo Pignato, PA-C  pyrazinamide 500 MG tablet Take 4 tablets (2,000 mg total) by mouth daily. Patient not taking: Reported on 10/01/2015 06/06/15   Leatha Gildingostin M Gherghe, MD  pyridOXINE (B-6) 50 MG tablet Take 1 tablet (50 mg total) by mouth daily.  Patient not taking: Reported on 10/01/2015 06/06/15   Leatha Gildingostin M Gherghe, MD  rifampin (RIFADIN) 300 MG capsule Take 2 capsules (600 mg total) by mouth daily. Patient not taking: Reported on 10/01/2015 06/06/15   Leatha Gildingostin M Gherghe, MD  traMADol (ULTRAM) 50 MG tablet Take 1 tablet (50 mg total) by mouth every 6 (six) hours as needed. Patient not taking: Reported on 10/01/2015 06/06/15   Leatha Gildingostin M Gherghe, MD   BP 153/83 mmHg  Pulse  88  Temp(Src) 98.8 F (37.1 C) (Oral)  Resp 21  SpO2 100%  LMP 08/18/2015 Physical Exam  Constitutional: She is oriented to person, place, and time. She appears well-developed and well-nourished. No distress.  HENT:  Head: Normocephalic and atraumatic.  Mouth/Throat: Oropharynx is clear and moist.  Tympanic membranes with normal architecture and good light reflex, no lesions.  Eyes: Conjunctivae and EOM are normal. Pupils are equal, round, and reactive to light.  No TTP of maxillary or frontal sinuses  No TTP or induration of temporal arteries bilaterally  Neck: Normal range of motion. Neck supple.  FROM to C-spine. Pt can touch chin to chest without discomfort. No TTP of midline cervical spine.   Cardiovascular: Normal rate, regular rhythm and intact distal pulses.   Pulmonary/Chest: Effort normal and breath sounds normal. No respiratory distress. She has no wheezes. She has no rales. She exhibits no tenderness.  Abdominal: Soft. Bowel sounds are normal. She exhibits no distension and no mass. There is no tenderness. There is no rebound and no guarding.  Musculoskeletal: Normal range of motion. She exhibits no edema or tenderness.  Neurological: She is alert and oriented to person, place, and time. No cranial nerve deficit.  II-Visual fields grossly intact. III/IV/VI-Extraocular movements intact.  Pupils reactive bilaterally. V/VII-Smile symmetric, equal eyebrow raise,  facial sensation intact VIII- Hearing grossly intact IX/X-Normal gag XI-bilateral shoulder shrug XII-midline tongue extension Motor: 5/5 bilaterally with normal tone and bulk Cerebellar: Normal finger-to-nose  and normal heel-to-shin test.   Romberg negative Ambulates with a coordinated gait  Dix Hallpike negative bilaterally   Skin: She is not diaphoretic.  Psychiatric: She has a normal mood and affect.  Nursing note and vitals reviewed.   ED Course  Procedures (including critical care time) Labs  Review Labs Reviewed  COMPREHENSIVE METABOLIC PANEL - Abnormal; Notable for the following:    Glucose, Bld 144 (*)    All other components within normal limits  CBC - Abnormal; Notable for the following:    Hemoglobin 10.7 (*)    HCT 33.4 (*)    All other components within normal limits  URINALYSIS, ROUTINE W REFLEX MICROSCOPIC (NOT AT [facility]) - Abnormal; Notable for the following:    APPearance CLOUDY (*)    All other components within normal limits  CBG MONITORING, ED - Abnormal; Notable for the following:    Glucose-Capillary 126 (*)    All other components within normal limits  LIPASE, BLOOD  I-STAT BETA HCG BLOOD, ED ([facility], [facility], [facility] ONLY)    Imaging Review No results found. I have personally reviewed and evaluated these images and lab results as part of my medical decision-making.   EKG Interpretation   Date/Time:  Saturday October 01 2015 10:53:34 EDT Ventricular Rate:  73 PR Interval:    QRS Duration: 93 QT Interval:  434 QTC Calculation: 479 R Axis:   24 Text Interpretation:  Sinus rhythm Confirmed by Lincoln Brighamees, Liz [id number]) on  10/01/2015 12:37:59 PM      MDM   Final  diagnoses:  Dehydration    Filed Vitals:   10/01/15 1300 10/01/15 1315 10/01/15 1330 10/01/15 1345  BP:  131/79 141/78 153/83  Pulse:  59 62 88  Temp:      TempSrc:      Resp: SpO2:  99% 100% 100%    Medications  ondansetron (ZOFRAN) injection 4 mg (not administered)  sodium chloride 0.9 % bolus 2,000 mL (0 mLs Intravenous Stopped 10/01/15 1426)  ondansetron (ZOFRAN) injection 4 mg (4 mg Intravenous Given 10/01/15 1138)  meclizine (ANTIVERT) tablet 50 mg (50 mg Oral Given 10/01/15 1254)    Adelina Mings Kayson Bullis is 45 y.o. female presenting with Multiple episodes of emesis onset this a.m. with associated lightheadedness/room spinning. Neuro exam is nonfocal. She states that she feels better after hydration, Zofran and meclizine. Patient has past oral challenge. I've advised her that she  may want to break her fast if she feels nauseous tomorrow. Will follow with primary care doctor.  Discussed case with attending physician who agrees with care plan and disposition.   Evaluation does not show pathology that would require ongoing emergent intervention or inpatient treatment. Pt is hemodynamically stable and mentating appropriately. Discussed findings and plan with patient/guardian, who agrees with care plan. All questions answered. Return precautions discussed and outpatient follow up given.   New Prescriptions   MECLIZINE (ANTIVERT) 25 MG TABLET    Take 1 tablet (25 mg total) by mouth 3 (three) times daily as needed for dizziness.   ONDANSETRON (ZOFRAN) 4 MG TABLET    Take 1 tablet (4 mg total) by mouth every 8 (eight) hours as needed for nausea or vomiting.        Wynetta Emery, PA-C 10/01/15 1529  Tilden Fossa, MD 10/02/15 [phone]

## 2015-10-01 NOTE — ED Notes (Signed)
D/C papers reviewed with patient and family with translator phone. Patient denies questions and verbalizes understanding of follow up instructions

## 2015-10-01 NOTE — ED Notes (Signed)
Patient came in complaining of nausea and vomiting 5-6 times starting at 9:30 this AM. Vomit is clear and Patient is completing TB treatment that she started in Feb. She complains of weakness and dizziness.

## 2015-10-24 ENCOUNTER — Ambulatory Visit: Payer: Self-pay | Admitting: Family Medicine

## 2015-12-23 ENCOUNTER — Ambulatory Visit: Payer: Medicaid Other | Admitting: Family Medicine

## 2015-12-30 ENCOUNTER — Ambulatory Visit
Admission: RE | Admit: 2015-12-30 | Discharge: 2015-12-30 | Disposition: A | Discharge status: Home / Self Care | Payer: No Typology Code available for payment source | Source: Ambulatory Visit | Attending: Infectious Disease | Admitting: Infectious Disease

## 2015-12-30 ENCOUNTER — Other Ambulatory Visit: Payer: Self-pay | Admitting: Infectious Disease

## 2015-12-30 DIAGNOSIS — Z111 Encounter for screening for respiratory tuberculosis: Secondary | ICD-10-CM

## 2017-03-11 IMAGING — DX DG CHEST 1V
1 series · 1 of 1 positions shown · non-contrast
Comparison: 05/30/2015

CLINICAL DATA: Post thoracentesis

EXAM:
CHEST 1 VIEW

[chest pa]
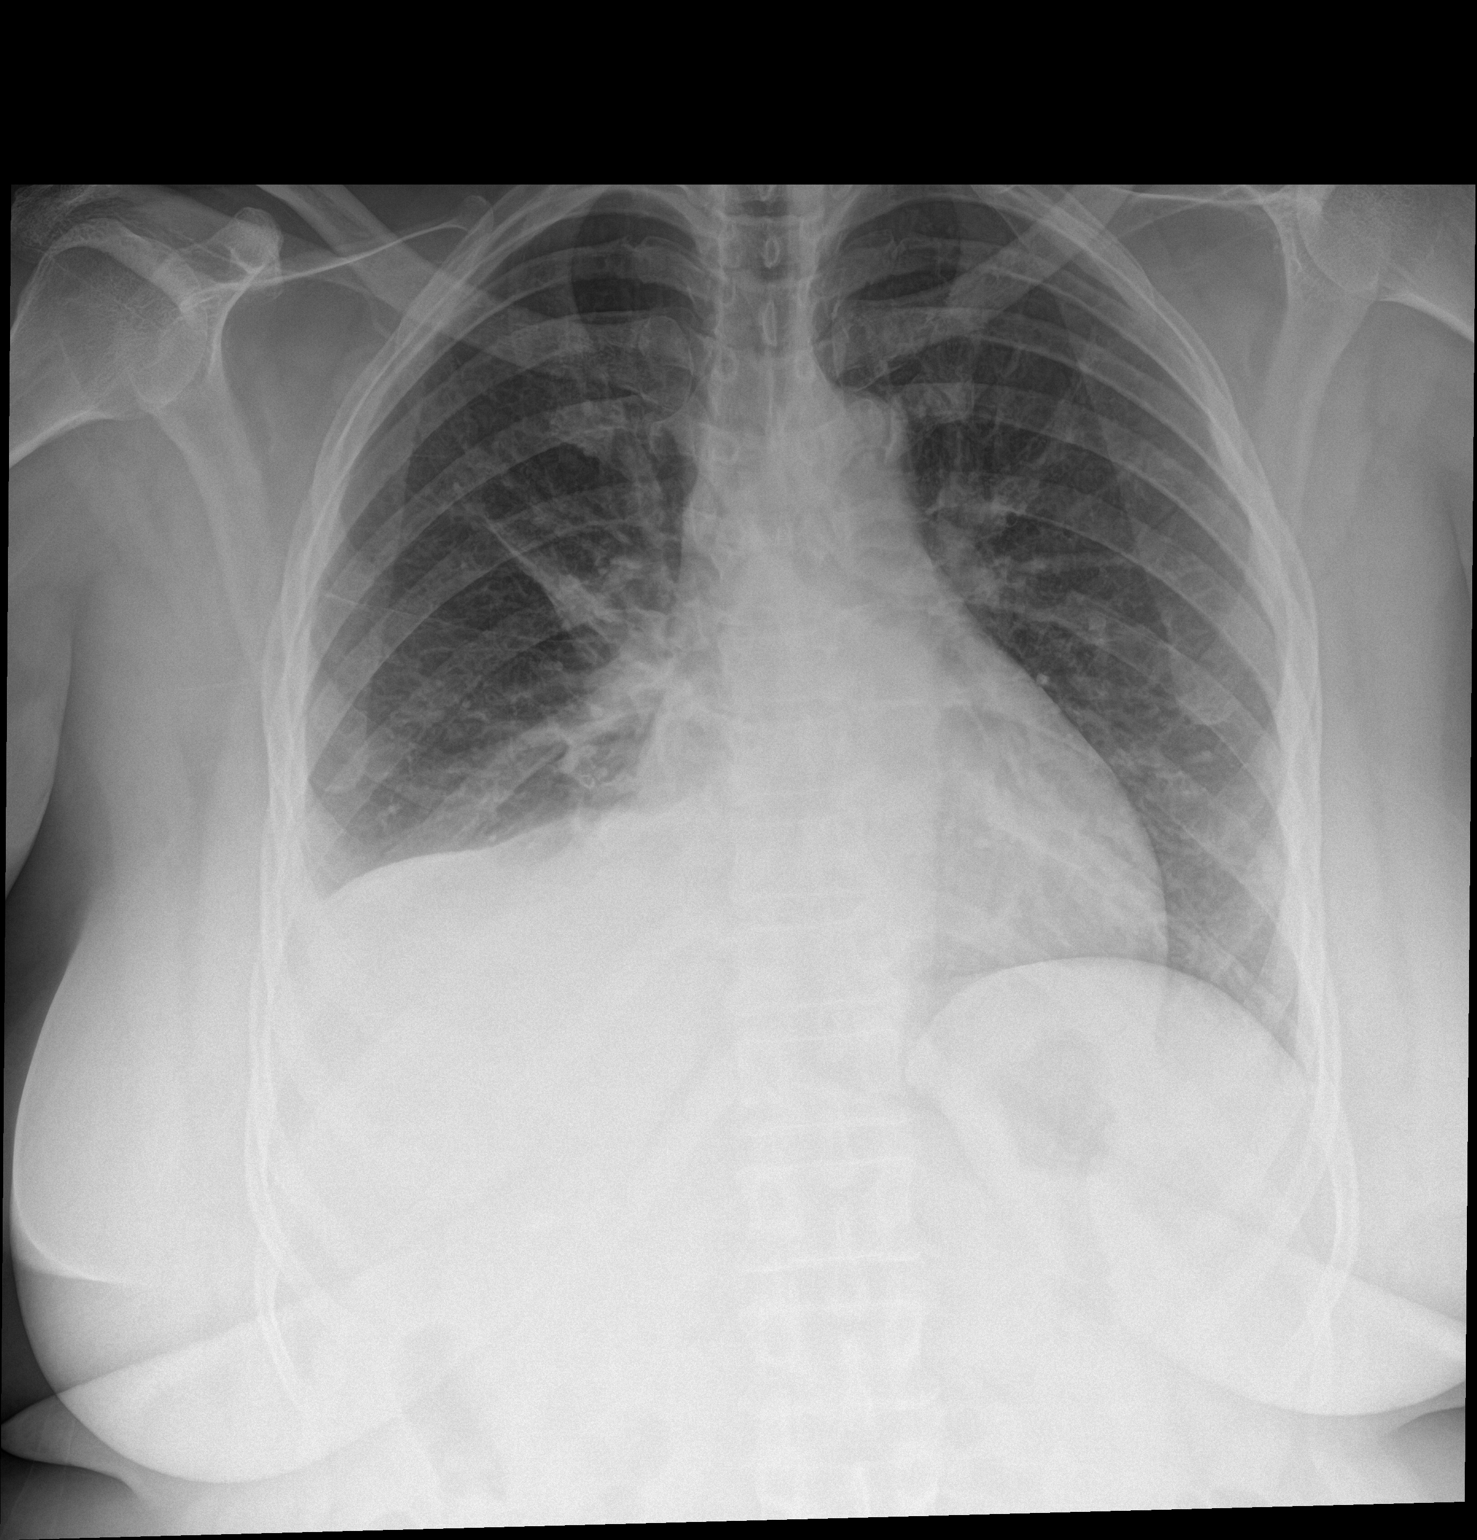

[1 of 1 positions shown; findings below may reference images not displayed]

FINDINGS: Decreasing right effusion post thoracentesis. No pneumothorax. Small
to moderate right pleural effusion persists with right base
atelectasis. Left lung is clear.
IMPRESSION: Decreasing right effusion following thoracentesis without
pneumothorax. Residual small to moderate right effusion with right
base atelectasis.

## 2017-03-13 IMAGING — CR DG CHEST 1V
1 series · 1 of 1 positions shown · non-contrast
Comparison: Chest x-ray 06/01/2015.

CLINICAL DATA: Status post thoracentesis.

EXAM:
CHEST 1 VIEW

[chest pa]
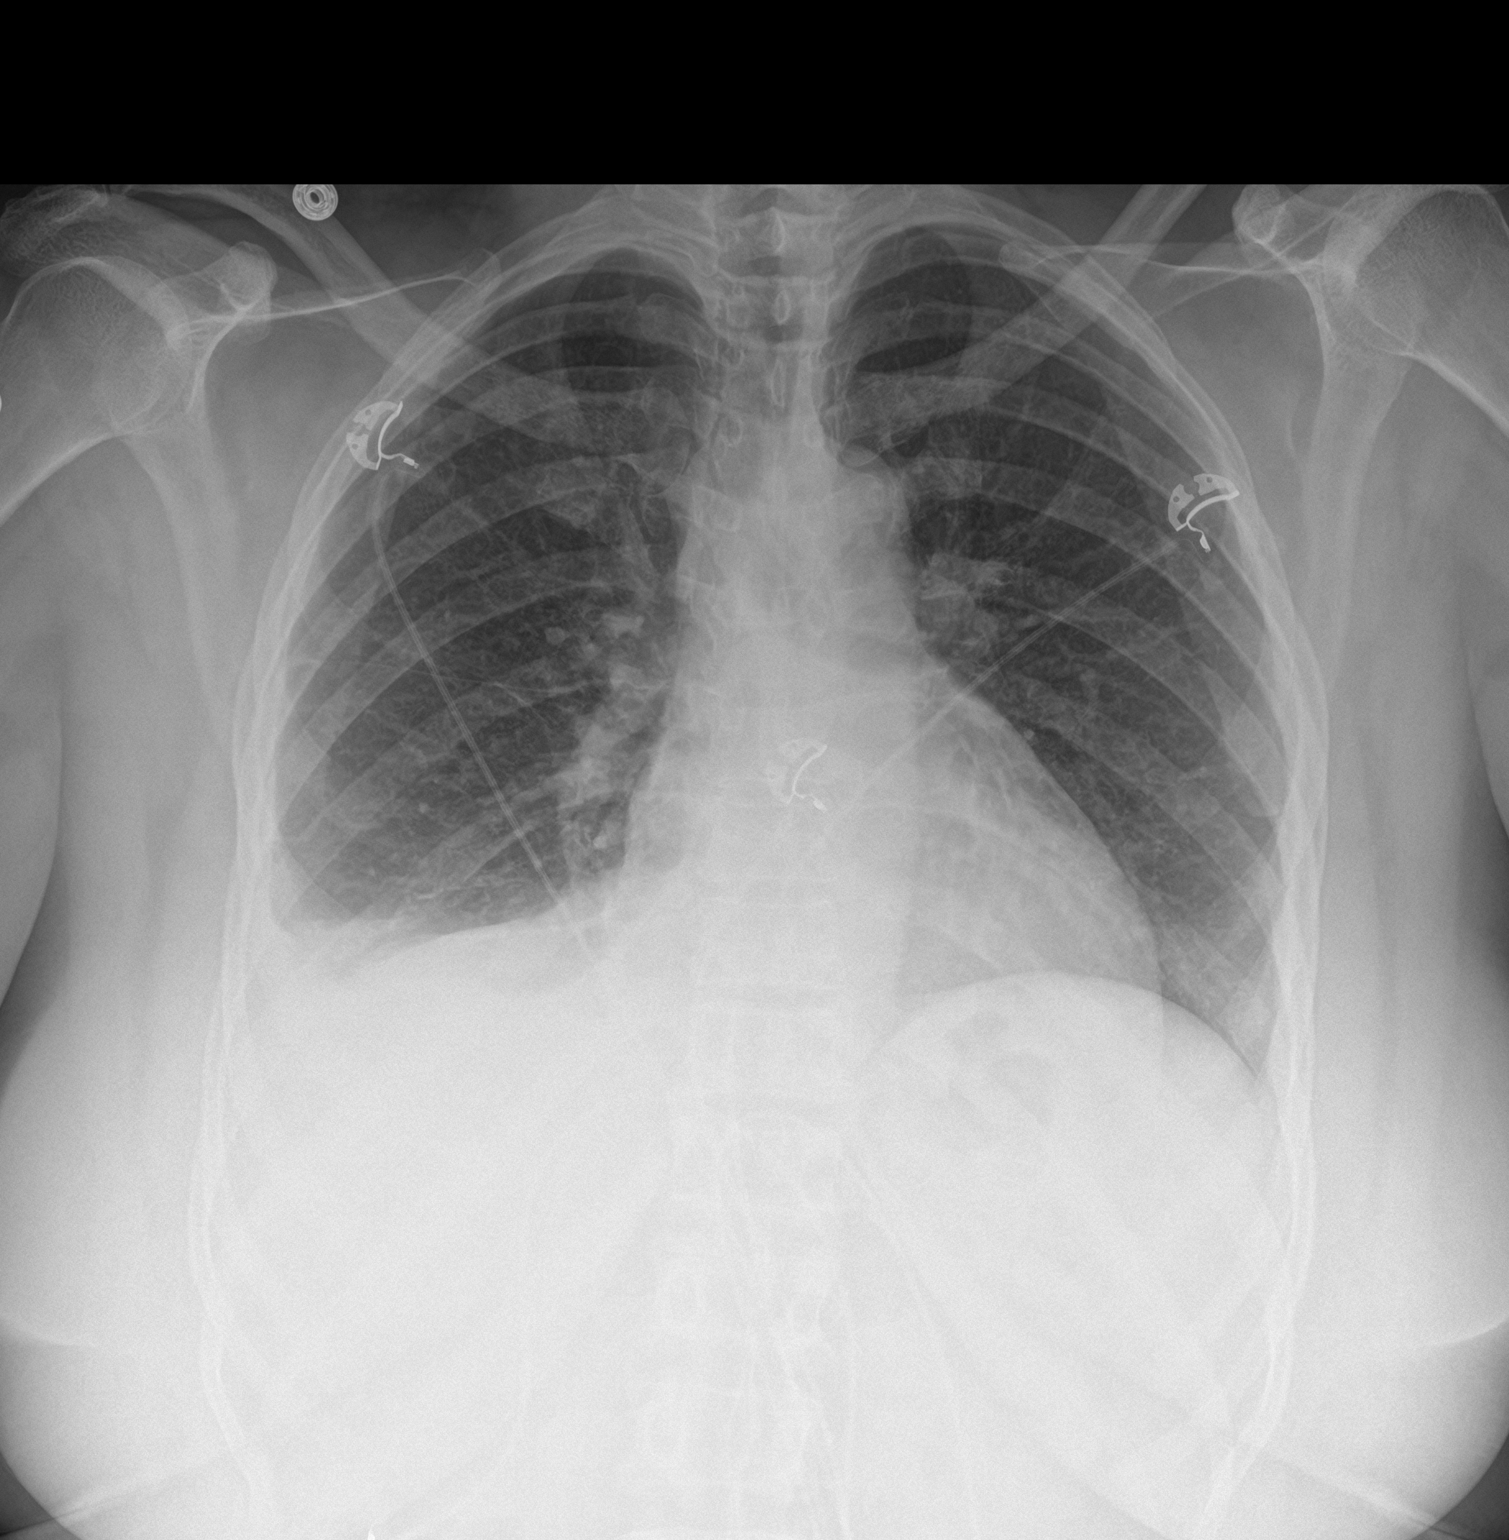

[1 of 1 positions shown; findings below may reference images not displayed]

FINDINGS: Interval decrease in size of the right-sided pleural effusion. No
postprocedural pneumothorax is identified. The left lung is clear.
IMPRESSION: Interval decrease in size of the right pleural effusion falling
ultrasound-guided thoracentesis. No postprocedural pneumothorax.

## 2017-03-15 IMAGING — CR DG SHOULDER 2+V PORT*R*
3 series · 3 of 3 positions shown · non-contrast
Comparison: 06/02/2015

CLINICAL DATA: Right shoulder. History right effusion and recent
thoracentesis

EXAM:
PORTABLE RIGHT SHOULDER - 2+ VIEW

[AP (1 of 3)]
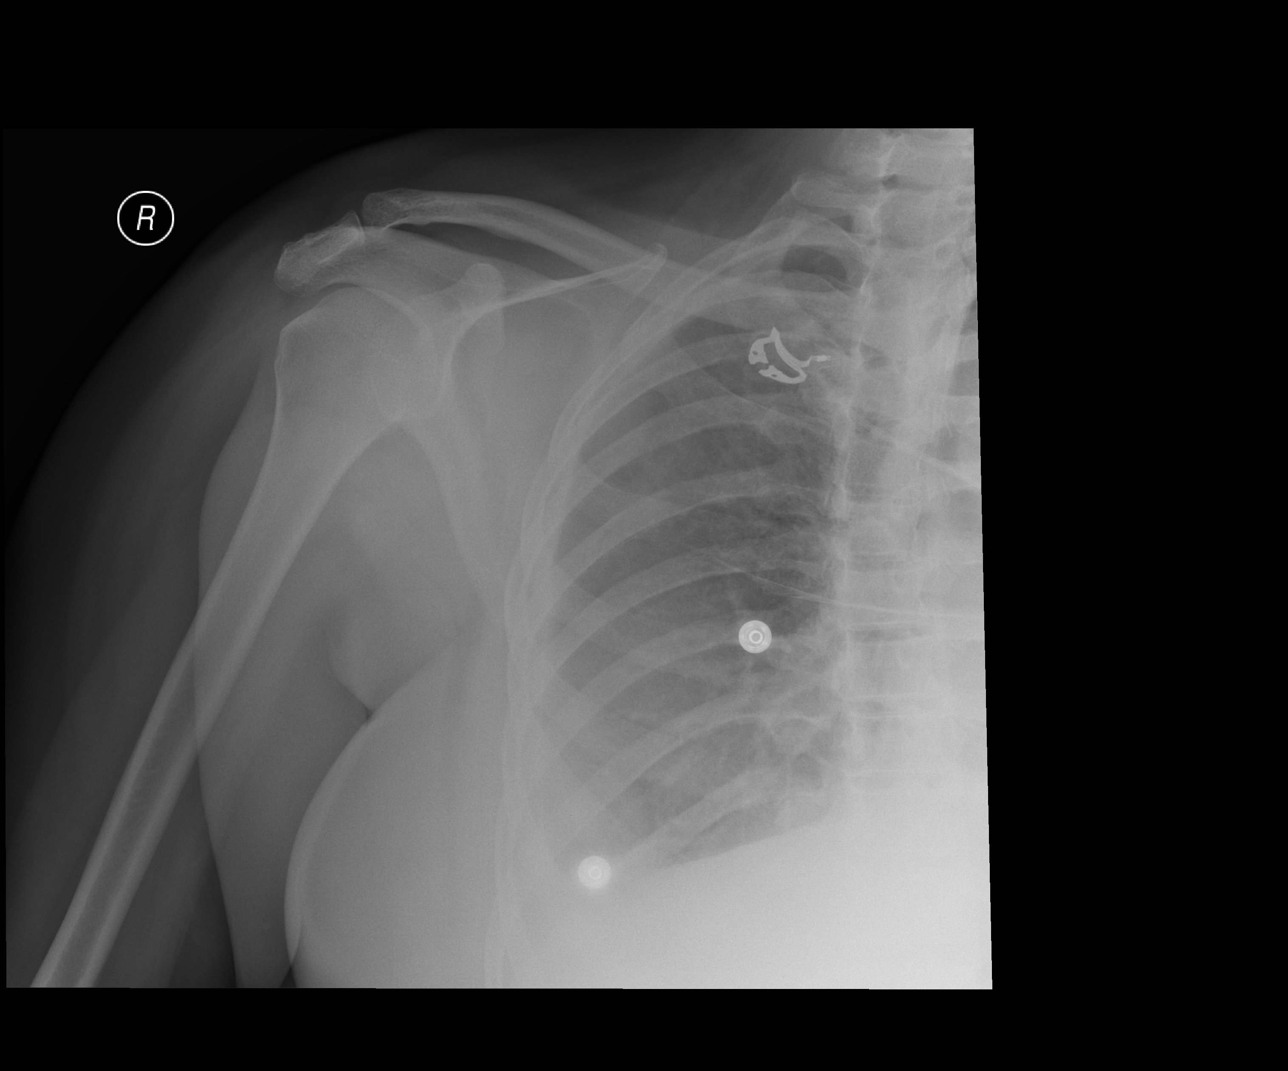

[AP (2 of 3)]
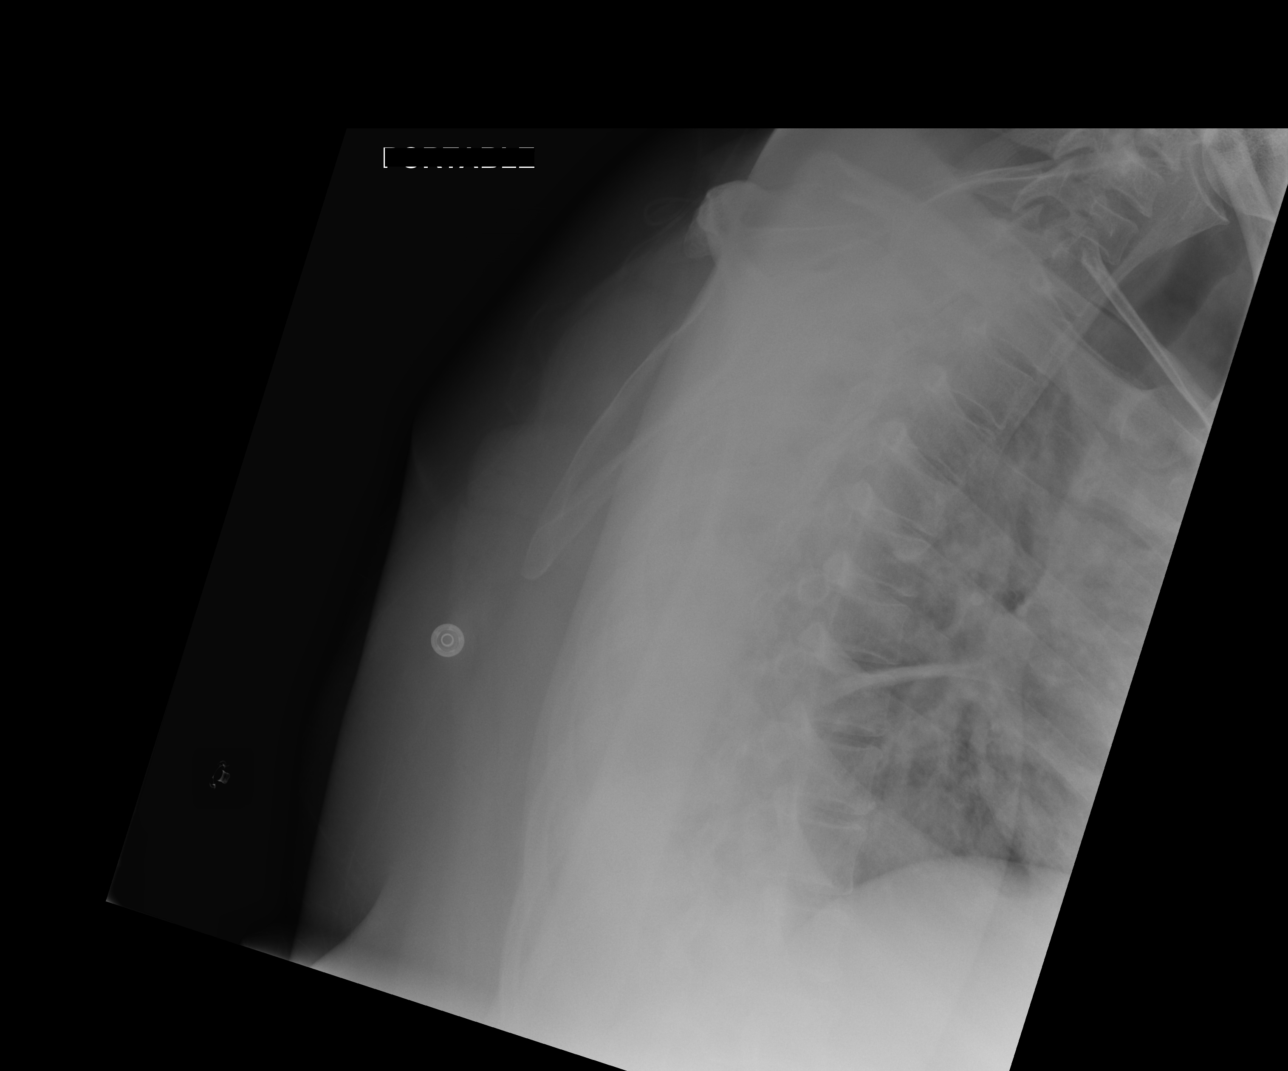

[AP (3 of 3)]
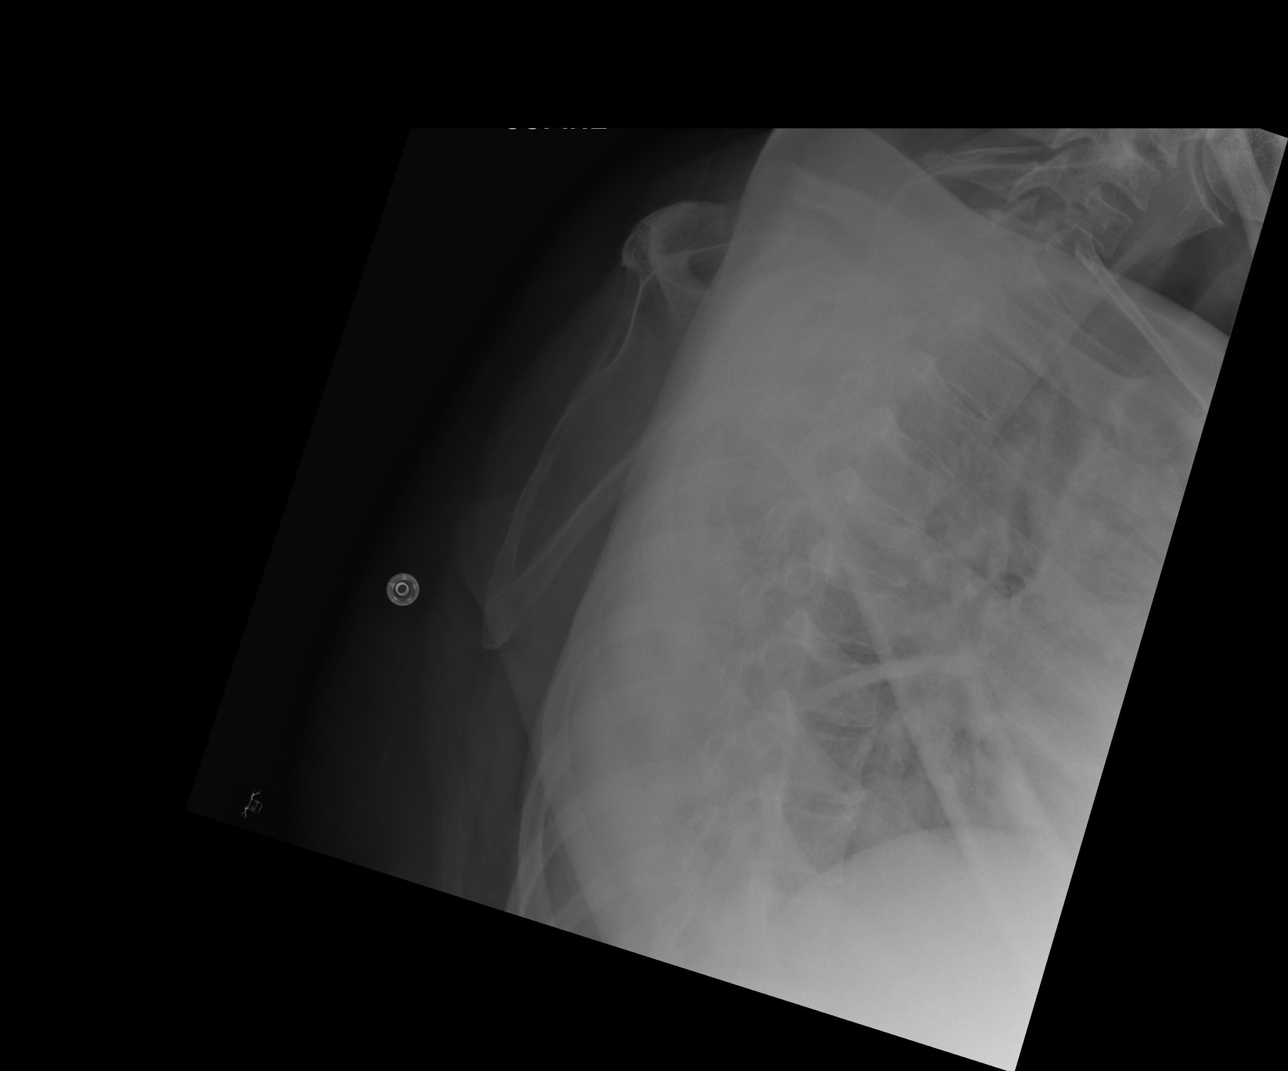

[3 of 3 positions shown; findings below may reference images not displayed]

FINDINGS: Degenerative changes in the right AC joint with joint space
narrowing and spurring. No acute bony abnormality. Specifically, no
fracture, subluxation, or dislocation. Soft tissues are intact.

Small right pleural effusion again noted, similar to prior study.
IMPRESSION: No acute bony abnormality. Mild degenerative changes in the right AC
joint.

Small right effusion.

## 2017-12-17 ENCOUNTER — Ambulatory Visit (HOSPITAL_COMMUNITY)
Admission: RE | Admit: 2017-12-17 | Discharge: 2017-12-17 | Disposition: A | Discharge status: Home / Self Care | Payer: BLUE CROSS/BLUE SHIELD | Source: Ambulatory Visit | Attending: Family Medicine | Admitting: Family Medicine

## 2017-12-17 ENCOUNTER — Other Ambulatory Visit (HOSPITAL_COMMUNITY): Payer: Self-pay | Admitting: Family Medicine

## 2017-12-17 DIAGNOSIS — S6991XA Unspecified injury of right wrist, hand and finger(s), initial encounter: Secondary | ICD-10-CM | POA: Diagnosis present

## 2017-12-17 DIAGNOSIS — R52 Pain, unspecified: Secondary | ICD-10-CM

## 2018-09-11 ENCOUNTER — Encounter (HOSPITAL_COMMUNITY): Payer: Self-pay | Admitting: Emergency Medicine

## 2018-09-11 ENCOUNTER — Emergency Department (HOSPITAL_COMMUNITY): Payer: BC Managed Care – PPO | Attending: Physician Assistant

## 2018-09-11 ENCOUNTER — Emergency Department (HOSPITAL_COMMUNITY)
Admission: EM | Admit: 2018-09-11 | Discharge: 2018-09-11 | Disposition: A | Discharge status: Home / Self Care | Payer: No Typology Code available for payment source | Attending: Emergency Medicine | Admitting: Emergency Medicine

## 2018-09-11 ENCOUNTER — Other Ambulatory Visit: Payer: Self-pay

## 2018-09-11 DIAGNOSIS — I10 Essential (primary) hypertension: Secondary | ICD-10-CM | POA: Diagnosis not present

## 2018-09-11 DIAGNOSIS — R109 Unspecified abdominal pain: Secondary | ICD-10-CM | POA: Diagnosis present

## 2018-09-11 DIAGNOSIS — R1030 Lower abdominal pain, unspecified: Secondary | ICD-10-CM | POA: Insufficient documentation

## 2018-09-11 DIAGNOSIS — M545 Low back pain, unspecified: Secondary | ICD-10-CM

## 2018-09-11 DIAGNOSIS — Z79899 Other long term (current) drug therapy: Secondary | ICD-10-CM | POA: Diagnosis not present

## 2018-09-11 LAB — CBC WITH DIFFERENTIAL/PLATELET
Abs Immature Granulocytes: 0.02 10*3/uL (ref 0.00–0.07)
Basophils Absolute: 0 10*3/uL (ref 0.0–0.1)
Basophils Relative: 0 %
Eosinophils Absolute: 0 10*3/uL (ref 0.0–0.5)
Eosinophils Relative: 0 %
HCT: 35.6 % — ABNORMAL LOW (ref 36.0–46.0)
Hemoglobin: 11.8 g/dL — ABNORMAL LOW (ref 12.0–15.0)
Immature Granulocytes: 0 %
Lymphocytes Relative: 23 %
Lymphs Abs: 1.2 10*3/uL (ref 0.7–4.0)
MCH: 29.7 pg (ref 26.0–34.0)
MCHC: 33.1 g/dL (ref 30.0–36.0)
MCV: 89.7 fL (ref 80.0–100.0)
Monocytes Absolute: 0.4 10*3/uL (ref 0.1–1.0)
Monocytes Relative: 8 %
Neutro Abs: 3.6 10*3/uL (ref 1.7–7.7)
Neutrophils Relative %: 69 %
Platelets: 209 10*3/uL (ref 150–400)
RBC: 3.97 MIL/uL (ref 3.87–5.11)
RDW: 12.8 % (ref 11.5–15.5)
WBC: 5.3 10*3/uL (ref 4.0–10.5)
nRBC: 0 % (ref 0.0–0.2)

## 2018-09-11 LAB — URINALYSIS, ROUTINE W REFLEX MICROSCOPIC
Bilirubin Urine: NEGATIVE
Glucose, UA: NEGATIVE mg/dL
Hgb urine dipstick: NEGATIVE
Ketones, ur: NEGATIVE mg/dL
Nitrite: NEGATIVE
Protein, ur: NEGATIVE mg/dL
Specific Gravity, Urine: 1.008 (ref 1.005–1.030)
pH: 7 (ref 5.0–8.0)

## 2018-09-11 LAB — COMPREHENSIVE METABOLIC PANEL
ALT: 19 U/L (ref 0–44)
AST: 20 U/L (ref 15–41)
Albumin: 3.8 g/dL (ref 3.5–5.0)
Alkaline Phosphatase: 61 U/L (ref 38–126)
Anion gap: 10 (ref 5–15)
BUN: 11 mg/dL (ref 6–20)
CO2: 26 mmol/L (ref 22–32)
Calcium: 9.4 mg/dL (ref 8.9–10.3)
Chloride: 105 mmol/L (ref 98–111)
Creatinine, Ser: 0.67 mg/dL (ref 0.44–1.00)
GFR calc Af Amer: >60 mL/min (ref >60)
GFR calc non Af Amer: >60 mL/min (ref >60)
Glucose, Bld: 99 mg/dL (ref 70–99)
Potassium: 3.8 mmol/L (ref 3.5–5.1)
Sodium: 141 mmol/L (ref 135–145)
Total Bilirubin: 0.8 mg/dL (ref 0.3–1.2)
Total Protein: 7.3 g/dL (ref 6.5–8.1)

## 2018-09-11 LAB — PREGNANCY, URINE: Preg Test, Ur: NEGATIVE

## 2018-09-11 MED ORDER — MORPHINE SULFATE (PF) 4 MG/ML IV SOLN
4.0000 mg | Freq: Once | INTRAVENOUS | Status: AC
  Administered 2018-09-11: 16:00:00 4 mg via INTRAVENOUS
  Filled 2018-09-11: qty 1

## 2018-09-11 MED ORDER — IOHEXOL 300 MG/ML  SOLN
100.0000 mL | Freq: Once | INTRAMUSCULAR | Status: AC | PRN
  Administered 2018-09-11: 100 mL via INTRAVENOUS

## 2018-09-11 NOTE — ED Provider Notes (Signed)
[facility] Provider Note   CSN: [id number] Arrival date & time: 09/11/18  1305   History   Chief Complaint Chief Complaint  Patient presents with  . Abdominal Pain  . Back Pain    HPI Sheena Ramirez is a 48 y.o. female.     HPI    48 year old female presents today status post trauma.  Patient notes yesterday a metal crate struck her in the lower lumbar region and pushed her into another crate.  She notes this caused pain in her anterior lower abdomen.  She notes at that time she had a loss of vaginal fluid.  She notes pain to the lower abdomen since, fullness in the vaginal region, and is if something has dropped down into her pelvis.  Patient notes that she has had normal urine since that time, no subsequent fluid loss.  She notes she did not have a bowel movement today.    Past Medical History:  Diagnosis Date  . Anemia   . 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 Hypertension   . Pleural effusion 05/2015    Patient Active Problem List   Diagnosis Date Noted  . 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 Pleural effusion on right   . Community acquired pneumonia   . S/P thoracentesis   . Exudative pleural effusion   . FUO (fever of unknown origin)   . Right shoulder pain   . Pleural effusion, right 05/31/2015  . Acute respiratory failure (HCC) 05/31/2015  . Pleuritic chest pain 05/31/2015  . HTN (hypertension) 05/31/2015  . Microcytic anemia 05/31/2015  . Language barrier, cultural differences 05/31/2015  . Hyperglycemia 05/31/2015  . Acute respiratory distress     Past Surgical History:  Procedure Laterality Date  . NO PAST SURGERIES       OB History   No obstetric history on file.      Home Medications    Prior to Admission medications   Medication Sig Start Date End Date Taking? Authorizing Provider  ethambutol (MYAMBUTOL) 400 MG tablet Take 4 tablets (1,600 mg total) by mouth daily. Patient not  taking: Reported on 10/01/2015 06/06/15   Leatha GildingGherghe, Costin M, MD  ferrous sulfate 325 (65 FE) MG EC tablet Take 1 tablet (325 mg total) by mouth 3 (three) times daily with meals. Patient not taking: Reported on 10/01/2015 06/20/15   Henrietta HooverBernhardt, Linda C, NP  isoniazid (NYDRAZID) 300 MG tablet Take 1 tablet (300 mg total) by mouth daily. Patient not taking: Reported on 10/01/2015 06/06/15   Leatha GildingGherghe, Costin M, MD  lisinopril (PRINIVIL,ZESTRIL) 10 MG tablet Take 2 tablets (20 mg total) by mouth daily. Patient taking differently: Take 10 mg by mouth daily.  06/20/15   Henrietta HooverBernhardt, Linda C, NP  meclizine (ANTIVERT) 25 MG tablet Take 1 tablet (25 mg total) by mouth 3 (three) times daily as needed for dizziness. 10/01/15   Pisciotta, Joni ReiningNicole, PA-C  ondansetron (ZOFRAN) 4 MG tablet Take 1 tablet (4 mg total) by mouth every 8 (eight) hours as needed for nausea or vomiting. 10/01/15   Pisciotta, Joni ReiningNicole, PA-C  pyrazinamide 500 MG tablet Take 4 tablets (2,000 mg total) by mouth daily. Patient not taking: Reported on 10/01/2015 06/06/15   Leatha GildingGherghe, Costin M, MD  pyridOXINE (B-6) 50 MG tablet Take 1 tablet (50 mg total) by mouth daily. Patient not taking: Reported on 10/01/2015 06/06/15   Leatha GildingGherghe, Costin M, MD  rifampin (RIFADIN) 300 MG capsule Take 2 capsules (600 mg total) by mouth daily. Patient  not taking: Reported on 10/01/2015 06/06/15   Leatha Gilding, MD  traMADol (ULTRAM) 50 MG tablet Take 1 tablet (50 mg total) by mouth every 6 (six) hours as needed. Patient not taking: Reported on 10/01/2015 06/06/15   Leatha Gilding, MD    Family History Family History  Problem Relation Age of Onset  . Anemia Mother   . Heart disease Mother     Social History Social History   Tobacco Use  . Smoking status: Never Smoker  . Smokeless tobacco: Never Used  Substance Use Topics  . Alcohol use: No  . Drug use: No     Allergies   Patient has no known allergies.   Review of Systems Review of Systems  All other  systems reviewed and are negative.    Physical Exam Updated Vital Signs BP (!) 133/93 (BP Location: Right Arm)   Pulse 75   Temp 98 F (36.7 C) (Oral)   Resp 14   SpO2 99%   Physical Exam Vitals signs and nursing note reviewed.  Constitutional:      Appearance: She is well-developed.  HENT:     Head: Normocephalic and atraumatic.  Eyes:     General: No scleral icterus.       Right eye: No discharge.        Left eye: No discharge.     Conjunctiva/sclera: Conjunctivae normal.     Pupils: Pupils are equal, round, and reactive to light.  Neck:     Musculoskeletal: Normal range of motion.     Vascular: No JVD.     Trachea: No tracheal deviation.  Pulmonary:     Effort: Pulmonary effort is normal.     Breath sounds: No stridor.  Abdominal:     Comments: Exquisite tenderness to the suprapubic region, no significant right or left lower abdominal tenderness no bruising noted  Musculoskeletal:     Comments: No cervical or thoracic spinal tenderness palpation, tenderness palpation diffusely to the lower lumbar region and posterior hips-bilateral hips nontender and stable with both AP and lateral compression-lateral sensation strength and motor function to lower extremities intact  Neurological:     Mental Status: She is alert and oriented to person, place, and time.     Coordination: Coordination normal.  Psychiatric:        Behavior: Behavior normal.        Thought Content: Thought content normal.        Judgment: Judgment normal.      ED Treatments / Results  Labs (all labs ordered are listed, but only abnormal results are displayed) Labs Reviewed  CBC WITH DIFFERENTIAL/PLATELET - Abnormal; Notable for the following components:      Result Value   Hemoglobin 11.8 (*)    HCT 35.6 (*)    All other components within normal limits  URINALYSIS, ROUTINE W REFLEX MICROSCOPIC - Abnormal; Notable for the following components:   Color, Urine STRAW (*)    Leukocytes,Ua MODERATE  (*)    Bacteria, UA RARE (*)    All other components within normal limits  COMPREHENSIVE METABOLIC PANEL  PREGNANCY, URINE    EKG None  Radiology No results found.  Procedures Procedures (including critical care time)  Medications Ordered in ED Medications  morphine 4 MG/ML injection 4 mg (4 mg Intravenous Given 09/11/18 1542)  iohexol (OMNIPAQUE) 300 MG/ML solution 100 mL (100 mLs Intravenous Contrast Given 09/11/18 1651)     Initial Impression / Assessment and Plan / ED Course  I have reviewed the triage vital signs and the nursing notes.  Pertinent labs & imaging results that were available during my care of the patient were reviewed by me and considered in my medical decision making (see chart for details).        Labs: Pregnancy urine, urinalysis, CBC CMP  Imaging: CT abdomen and pelvis with contrast  Consults:  Therapeutics:  Discharge Meds:   Assessment/Plan: 48 year old female presents today with abdominal trauma.  She has no objective findings of trauma on my exam.  Her labs and imaging are reassuring.  She has reassuring physical exam with no acute abnormalities.  Patient discharged with outpatient follow-up and strict return precautions.  She verbalized understanding and agreement to today's plan.  Translator used throughout.    Final Clinical Impressions(s) / ED Diagnoses   Final diagnoses:  Abdominal pain, unspecified abdominal location  Acute midline low back pain without sciatica    ED Discharge Orders    None       Eyvonne Mechanic, PA-C 09/15/18 1223    Alvira Monday, MD 09/16/18 1439

## 2018-09-11 NOTE — ED Notes (Signed)
Patient transported to CT 

## 2018-09-11 NOTE — ED Notes (Signed)
Patient verbalizes understanding of discharge instructions. Opportunity for questioning and answers were provided. Armband removed by staff, pt discharged from ED.  

## 2018-09-11 NOTE — Discharge Instructions (Addendum)
Please read attached information. If you experience any new or worsening signs or symptoms please return to the emergency room for evaluation. Please follow-up with your primary care provider or specialist as discussed.  °

## 2018-09-11 NOTE — ED Triage Notes (Addendum)
Pt arrives to ED from home with complaints of abdominal pain, back pain, and pain on urination. Pt states that she was at work yesterday around noon and a large box hit her in the lower back causing her to fall onto another box that struck her lower abdomen. Pt states after the blow to her abdomen she saw "clear fluid" come out of her vagina. Pt states she feels like one of her organs has shifted down to her vagina.

## 2019-03-20 ENCOUNTER — Ambulatory Visit: Payer: BC Managed Care – PPO | Admitting: Podiatry

## 2020-08-17 ENCOUNTER — Other Ambulatory Visit: Payer: Self-pay | Admitting: Cardiovascular Disease

## 2020-08-17 DIAGNOSIS — Z1231 Encounter for screening mammogram for malignant neoplasm of breast: Secondary | ICD-10-CM

## 2020-08-31 ENCOUNTER — Ambulatory Visit
Admission: RE | Admit: 2020-08-31 | Discharge: 2020-08-31 | Disposition: A | Discharge status: Home / Self Care | Payer: BC Managed Care – PPO | Source: Ambulatory Visit | Attending: Cardiovascular Disease | Admitting: Cardiovascular Disease

## 2020-08-31 ENCOUNTER — Other Ambulatory Visit: Payer: Self-pay | Admitting: Cardiovascular Disease

## 2020-08-31 ENCOUNTER — Encounter (INDEPENDENT_AMBULATORY_CARE_PROVIDER_SITE_OTHER): Payer: Self-pay

## 2020-08-31 DIAGNOSIS — M25561 Pain in right knee: Secondary | ICD-10-CM

## 2021-08-14 ENCOUNTER — Other Ambulatory Visit: Payer: Self-pay | Admitting: Cardiovascular Disease

## 2021-08-14 ENCOUNTER — Ambulatory Visit
Admission: RE | Admit: 2021-08-14 | Discharge: 2021-08-14 | Disposition: A | Discharge status: Home / Self Care | Payer: Self-pay | Source: Ambulatory Visit | Attending: Cardiovascular Disease | Admitting: Cardiovascular Disease

## 2021-08-14 DIAGNOSIS — M79642 Pain in left hand: Secondary | ICD-10-CM

## 2021-08-14 DIAGNOSIS — M25522 Pain in left elbow: Secondary | ICD-10-CM
